# Patient Record
Sex: Female | Born: 1990 | Race: White | Hispanic: No | Marital: Married | Smoking: Never smoker
Health system: Southern US, Community
[De-identification: ages and names within clinical notes are randomized; demographics above are authoritative.]

## PROBLEM LIST (undated history)

## (undated) DIAGNOSIS — R519 Headache, unspecified: Secondary | ICD-10-CM

## (undated) DIAGNOSIS — F329 Major depressive disorder, single episode, unspecified: Secondary | ICD-10-CM

## (undated) DIAGNOSIS — F419 Anxiety disorder, unspecified: Secondary | ICD-10-CM

## (undated) DIAGNOSIS — N800 Endometriosis of uterus: Secondary | ICD-10-CM

## (undated) DIAGNOSIS — Z8614 Personal history of Methicillin resistant Staphylococcus aureus infection: Secondary | ICD-10-CM

## (undated) DIAGNOSIS — B9689 Other specified bacterial agents as the cause of diseases classified elsewhere: Secondary | ICD-10-CM

## (undated) DIAGNOSIS — N760 Acute vaginitis: Secondary | ICD-10-CM

## (undated) DIAGNOSIS — N8003 Adenomyosis of the uterus: Secondary | ICD-10-CM

## (undated) DIAGNOSIS — J45909 Unspecified asthma, uncomplicated: Secondary | ICD-10-CM

## (undated) DIAGNOSIS — B977 Papillomavirus as the cause of diseases classified elsewhere: Secondary | ICD-10-CM

## (undated) DIAGNOSIS — F32A Depression, unspecified: Secondary | ICD-10-CM

## (undated) DIAGNOSIS — R51 Headache: Secondary | ICD-10-CM

## (undated) HISTORY — DX: Acute vaginitis: N76.0

## (undated) HISTORY — DX: Major depressive disorder, single episode, unspecified: F32.9

## (undated) HISTORY — DX: Papillomavirus as the cause of diseases classified elsewhere: B97.7

## (undated) HISTORY — DX: Depression, unspecified: F32.A

## (undated) HISTORY — DX: Acute vaginitis: B96.89

## (undated) HISTORY — DX: Anxiety disorder, unspecified: F41.9

## (undated) HISTORY — DX: Other specified bacterial agents as the cause of diseases classified elsewhere: B96.89

---
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
## 2008-05-09 ENCOUNTER — Observation Stay: Payer: Self-pay

## 2008-06-23 ENCOUNTER — Observation Stay: Payer: Self-pay | Admitting: Unknown Physician Specialty

## 2008-06-25 ENCOUNTER — Observation Stay: Payer: Self-pay

## 2008-06-27 ENCOUNTER — Observation Stay: Payer: Self-pay

## 2008-07-05 ENCOUNTER — Inpatient Hospital Stay: Payer: Self-pay

## 2008-07-11 ENCOUNTER — Ambulatory Visit: Payer: Self-pay | Admitting: Pediatrics

## 2011-06-16 HISTORY — PX: TUBAL LIGATION: SHX77

## 2011-07-03 ENCOUNTER — Emergency Department: Payer: Self-pay | Admitting: Emergency Medicine

## 2011-07-03 LAB — URINALYSIS, COMPLETE
Blood: NEGATIVE
Nitrite: NEGATIVE
Ph: 5 (ref 4.5–8.0)
Protein: NEGATIVE
RBC,UR: 1 /HPF (ref 0–5)
WBC UR: 5 /HPF (ref 0–5)

## 2011-07-03 LAB — COMPREHENSIVE METABOLIC PANEL
Albumin: 4.4 g/dL (ref 3.4–5.0)
Anion Gap: 12 (ref 7–16)
Calcium, Total: 8.9 mg/dL (ref 8.5–10.1)
Chloride: 104 mmol/L (ref 98–107)
Co2: 22 mmol/L (ref 21–32)
EGFR (African American): >60
Glucose: 72 mg/dL (ref 65–99)
Osmolality: 273 (ref 275–301)
Potassium: 3.6 mmol/L (ref 3.5–5.1)
SGOT(AST): 21 U/L (ref 15–37)
Sodium: 138 mmol/L (ref 136–145)

## 2011-07-03 LAB — CBC WITH DIFFERENTIAL/PLATELET
Eosinophil #: 0.1 10*3/uL (ref 0.0–0.7)
Eosinophil %: 0.6 %
HCT: 41 % (ref 35.0–47.0)
Lymphocyte #: 1.8 10*3/uL (ref 1.0–3.6)
MCH: 31.7 pg (ref 26.0–34.0)
MCHC: 34.7 g/dL (ref 32.0–36.0)
MCV: 92 fL (ref 80–100)
Monocyte #: 0.5 10*3/uL (ref 0.0–0.7)
Monocyte %: 5.2 %
Neutrophil #: 7.7 10*3/uL — ABNORMAL HIGH (ref 1.4–6.5)
Platelet: 160 10*3/uL (ref 150–440)
RBC: 4.48 10*6/uL (ref 3.80–5.20)
RDW: 12.9 % (ref 11.5–14.5)
WBC: 10.1 10*3/uL (ref 3.6–11.0)

## 2011-07-03 LAB — LIPASE, BLOOD: Lipase: 211 U/L (ref 73–393)

## 2011-07-03 LAB — HCG, QUANTITATIVE, PREGNANCY: Beta Hcg, Quant.: 195466 m[IU]/mL — ABNORMAL HIGH

## 2014-03-28 DIAGNOSIS — N390 Urinary tract infection, site not specified: Secondary | ICD-10-CM | POA: Insufficient documentation

## 2014-07-19 ENCOUNTER — Ambulatory Visit: Payer: Self-pay | Admitting: Family Medicine

## 2015-02-07 ENCOUNTER — Encounter: Payer: Self-pay | Admitting: *Deleted

## 2015-02-07 ENCOUNTER — Ambulatory Visit (INDEPENDENT_AMBULATORY_CARE_PROVIDER_SITE_OTHER): Payer: BC Managed Care – PPO | Admitting: Obstetrics and Gynecology

## 2015-02-07 ENCOUNTER — Encounter: Payer: Self-pay | Admitting: Obstetrics and Gynecology

## 2015-02-07 VITALS — BP 108/67 | HR 76 | Wt 119.4 lb

## 2015-02-07 DIAGNOSIS — N938 Other specified abnormal uterine and vaginal bleeding: Secondary | ICD-10-CM | POA: Diagnosis not present

## 2015-02-07 DIAGNOSIS — N920 Excessive and frequent menstruation with regular cycle: Secondary | ICD-10-CM

## 2015-02-07 NOTE — Patient Instructions (Signed)
Thank you for enrolling in MyChart. Please follow the instructions below to securely access your online medical record. MyChart allows you to send messages to your doctor, view your test results, renew your prescriptions, schedule appointments, and more.  How Do I Sign Up? 1. In your Internet browser, go to [email or web address] 2. Click on the New  User? link in the Sign In box.  3. Enter your MyChart Access Code exactly as it appears below. You will not need to use this code after you have completed the sign-up process. If you do not sign up before the expiration date, you must request a new code. MyChart Access Code: [email or web address] Expires: 04/08/2015 12:07 PM  4. Enter the last four digits of your Social Security Number (xxxx) and Date of Birth (mm/dd/yyyy) as indicated and click Next. You will be taken to the next sign-up page. 5. Create a MyChart ID. This will be your MyChart login ID and cannot be changed, so think of one that is secure and easy to remember. 6. Create a MyChart password. You can change your password at any time. 7. Enter your Password Reset Question and Answer and click Next. This can be used at a later time if you forget your password.  8. Select your communication preference, and if applicable enter your e-mail address. You will receive e-mail notification when new information is available in MyChart by choosing to receive e-mail notifications and filling in your e-mail. 9. Click Sign In. You can now view your medical record.   Additional Information If you have questions, you can email [email or web address] or call [phone] to talk to our MyChart staff. Remember, MyChart is NOT to be used for urgent needs. For medical emergencies, dial 911. Dysfunctional Uterine Bleeding Normally, menstrual periods begin between ages 33 to 79 in young women. A normal menstrual cycle/period may begin every 23 days up to 35 days and lasts from 1 to 7  days. Around 12 to 14 days before your menstrual period starts, ovulation (ovary produces an egg) occurs. When counting the time between menstrual periods, count from the first day of bleeding of the previous period to the first day of bleeding of the next period. Dysfunctional (abnormal) uterine bleeding is bleeding that is different from a normal menstrual period. Your periods may come earlier or later than usual. They may be lighter, have blood clots or be heavier. You may have bleeding between periods, or you may skip one period or more. You may have bleeding after sexual intercourse, bleeding after menopause, or no menstrual period. CAUSES   Pregnancy (normal, miscarriage, tubal).  IUDs (intrauterine device, birth control).  Birth control pills.  Hormone treatment.  Menopause.  Infection of the cervix.  Blood clotting problems.  Infection of the inside lining of the uterus.  Endometriosis, inside lining of the uterus growing in the pelvis and other female organs.  Adhesions (scar tissue) inside the uterus.  Obesity or severe weight loss.  Uterine polyps inside the uterus.  Cancer of the vagina, cervix, or uterus.  Ovarian cysts or polycystic ovary syndrome.  Medical problems (diabetes, thyroid disease).  Uterine fibroids (noncancerous tumor).  Problems with your female hormones.  Endometrial hyperplasia, very thick lining and enlarged cells inside of the uterus.  Medicines that interfere with ovulation.  Radiation to the pelvis or abdomen.  Chemotherapy. DIAGNOSIS   Your doctor will discuss the history of your menstrual periods, medicines you are taking, changes in your weight, stress in your  life, and any medical problems you may have.  Your doctor will do a physical and pelvic examination.  Your doctor may want to perform certain tests to make a diagnosis, such as:  Pap test.  Blood tests.  Cultures for infection.  CT  scan.  Ultrasound.  Hysteroscopy.  Laparoscopy.  MRI.  Hysterosalpingography.  D and C.  Endometrial biopsy. TREATMENT  Treatment will depend on the cause of the dysfunctional uterine bleeding (DUB). Treatment may include:  Observing your menstrual periods for a couple of months.  Prescribing medicines for medical problems, including:  Antibiotics.  Hormones.  Birth control pills.  Removing an IUD (intrauterine device, birth control).  Surgery:  D and C (scrape and remove tissue from inside the uterus).  Laparoscopy (examine inside the abdomen with a lighted tube).  Uterine ablation (destroy lining of the uterus with electrical current, laser, heat, or freezing).  Hysteroscopy (examine cervix and uterus with a lighted tube).  Hysterectomy (remove the uterus). HOME CARE INSTRUCTIONS   If medicines were prescribed, take exactly as directed. Do not change or switch medicines without consulting your caregiver.  Long term heavy bleeding may result in iron deficiency. Your caregiver may have prescribed iron pills. They help replace the iron that your body lost from heavy bleeding. Take exactly as directed.  Do not take aspirin or medicines that contain aspirin one week before or during your menstrual period. Aspirin may make the bleeding worse.  If you need to change your sanitary pad or tampon more than once every 2 hours, stay in bed with your feet elevated and a cold pack on your lower abdomen. Rest as much as possible, until the bleeding stops or slows down.  Eat well-balanced meals. Eat foods high in iron. Examples are:  Leafy green vegetables.  Whole-grain breads and cereals.  Eggs.  Meat.  Liver.  Do not try to lose weight until the abnormal bleeding has stopped and your blood iron level is back to normal. Do not lift more than ten pounds or do strenuous activities when you are bleeding.  For a couple of months, make note on your calendar, marking the  start and ending of your period, and the type of bleeding (light, medium, heavy, spotting, clots or missed periods). This is for your caregiver to better evaluate your problem. SEEK MEDICAL CARE IF:   You develop nausea (feeling sick to your stomach) and vomiting, dizziness, or diarrhea while you are taking your medicine.  You are getting lightheaded or weak.  You have any problems that may be related to the medicine you are taking.  You develop pain with your DUB.  You want to remove your IUD.  You want to stop or change your birth control pills or hormones.  You have any type of abnormal bleeding mentioned above.  You are over 90 years old and have not had a menstrual period yet.  You are 24 years old and you are still having menstrual periods.  You have any of the symptoms mentioned above.  You develop a rash. SEEK IMMEDIATE MEDICAL CARE IF:   An oral temperature above 102 F (38.9 C) develops.  You develop chills.  You are changing your sanitary pad or tampon more than once an hour.  You develop abdominal pain.  You pass out or faint. Document Released: 05/29/2000 Document Revised: 08/24/2011 Document Reviewed: 04/30/2009 [employer] Patient Information 2015 [place], [place]. This information is not intended to replace advice given to you by your health care provider.  Make sure you discuss any questions you have with your health care provider.  

## 2015-02-07 NOTE — Progress Notes (Signed)
Subjective:     Patient ID: Carrie Rios, female   DOB: February 03, 1991, 24 y.o.   MRN: [id number]  HPI Reports heavy menses since last delivery of last child and BTL in Aug 2013, with 3 menses occuring in the last 6 weeks.   Review of Systems Menses last 5-7 days with heavy bleeding 1-3 days, and mild cramps normally, the bleeding over the last 6 weeks has been heavy with moderate cramps, changing tampons q2h,     Objective:   Physical Exam A&O x4 Well groomed female in no distress Thyroid  Pelvic exam: normal external genitalia, vulva, vagina, cervix, uterus and adnexa.     Assessment:     Irregular menses with menorrgahia     Plan:     Labs obtained, pelvic u/s scheduled.  Also past due for AE will schedule

## 2015-02-08 LAB — THYROID PANEL WITH TSH
Free Thyroxine Index: 2.6 (ref 1.2–4.9)
T3 Uptake Ratio: 29 % (ref 24–39)
T4, Total: 9.1 ug/dL (ref 4.5–12.0)
TSH: 1.93 u[IU]/mL (ref 0.450–4.500)

## 2015-02-08 LAB — CBC WITH DIFFERENTIAL/PLATELET
BASOS ABS: 0 10*3/uL (ref 0.0–0.2)
Basos: 0 %
EOS (ABSOLUTE): 0.1 10*3/uL (ref 0.0–0.4)
Eos: 2 %
HEMOGLOBIN: 13.2 g/dL (ref 11.1–15.9)
Hematocrit: 38.6 % (ref 34.0–46.6)
IMMATURE GRANS (ABS): 0 10*3/uL (ref 0.0–0.1)
Immature Granulocytes: 0 %
LYMPHS ABS: 1 10*3/uL (ref 0.7–3.1)
LYMPHS: 22 %
MCH: 30.4 pg (ref 26.6–33.0)
MCHC: 34.2 g/dL (ref 31.5–35.7)
MCV: 89 fL (ref 79–97)
MONOCYTES: 9 %
Monocytes Absolute: 0.4 10*3/uL (ref 0.1–0.9)
NEUTROS ABS: 3.3 10*3/uL (ref 1.4–7.0)
Neutrophils: 67 %
PLATELETS: 190 10*3/uL (ref 150–379)
RBC: 4.34 x10E6/uL (ref 3.77–5.28)
RDW: 13.2 % (ref 12.3–15.4)
WBC: 4.8 10*3/uL (ref 3.4–10.8)

## 2015-02-08 LAB — FSH/LH
FSH: 7.1 m[IU]/mL
LH: 5.5 m[IU]/mL

## 2015-02-08 LAB — FERRITIN: Ferritin: 32 ng/mL (ref 15–150)

## 2015-02-22 ENCOUNTER — Ambulatory Visit (INDEPENDENT_AMBULATORY_CARE_PROVIDER_SITE_OTHER): Payer: BC Managed Care – PPO

## 2015-02-22 DIAGNOSIS — N938 Other specified abnormal uterine and vaginal bleeding: Secondary | ICD-10-CM

## 2015-02-22 DIAGNOSIS — N8320 Unspecified ovarian cysts: Secondary | ICD-10-CM

## 2015-02-22 DIAGNOSIS — N83201 Unspecified ovarian cyst, right side: Secondary | ICD-10-CM

## 2015-03-07 ENCOUNTER — Telehealth: Payer: Self-pay | Admitting: *Deleted

## 2015-03-07 NOTE — Telephone Encounter (Signed)
Pt called is having R pelvic pain, states the pain is constant x 1 day, per pt she is still taking her bc pills in her 3rd week She started bleeding again 03/05/15- states the bleeding isnt really heavy- she just didn't know what to do

## 2015-03-08 ENCOUNTER — Other Ambulatory Visit: Payer: Self-pay | Admitting: Obstetrics and Gynecology

## 2015-03-08 NOTE — Telephone Encounter (Signed)
Reassure her that this is all normal when first starting a pill, especially in leu of previous menstrual cycles, to keep taking the pill and spotting/bleeding should get better controlled by the time she comes in for next visit in Oct.  But to let us know if pain becomes severe or bleeding is heavy enough to soak a pad in an hour.

## 2015-03-08 NOTE — Telephone Encounter (Signed)
Called pt she voiced understanding 

## 2015-03-28 ENCOUNTER — Encounter: Payer: Self-pay | Admitting: Obstetrics and Gynecology

## 2015-03-28 ENCOUNTER — Other Ambulatory Visit: Payer: Self-pay | Admitting: Obstetrics and Gynecology

## 2015-03-28 ENCOUNTER — Ambulatory Visit (INDEPENDENT_AMBULATORY_CARE_PROVIDER_SITE_OTHER): Payer: BC Managed Care – PPO | Admitting: Obstetrics and Gynecology

## 2015-03-28 VITALS — BP 99/62 | HR 67 | Ht 61.0 in | Wt 120.8 lb

## 2015-03-28 DIAGNOSIS — Z01419 Encounter for gynecological examination (general) (routine) without abnormal findings: Secondary | ICD-10-CM | POA: Diagnosis not present

## 2015-03-28 MED ORDER — NORETHIN-ETH ESTRAD-FE BIPHAS 1 MG-10 MCG / 10 MCG PO TABS: 1.0000 | ORAL_TABLET | Freq: Every day | ORAL | Status: DC

## 2015-03-28 NOTE — Progress Notes (Signed)
  Subjective:     Carrie Rios is a 24 y.o. female and is here for a comprehensive physical exam. The patient reports improvement in bleeding since starting LoLoestrin and desires to continue at this time.  Social History   Social History  . Marital Status: Married    Spouse Name: N/A  . Number of Children: N/A  . Years of Education: N/A   Occupational History  . Not on file.   Social History Main Topics  . Smoking status: Never Smoker   . Smokeless tobacco: Never Used  . Alcohol Use: No  . Drug Use: No  . Sexual Activity: Yes    Birth Control/ Protection: Surgical   Other Topics Concern  . Not on file   Social History Narrative   Health Maintenance  Topic Date Due  . HIV Screening  02/07/2004  . TETANUS/TDAP  02/07/2008  . PAP SMEAR  02/06/2010  . INFLUENZA VACCINE  01/14/2015    The following portions of the patient's history were reviewed and updated as appropriate: allergies, current medications, past family history, past medical history, past social history, past surgical history and problem list.  Review of Systems Pertinent items noted in HPI and remainder of comprehensive ROS otherwise negative.   Objective:    General appearance: alert, cooperative and appears stated age Neck: no adenopathy, no carotid bruit, no JVD, supple, symmetrical, trachea midline and thyroid not enlarged, symmetric, no tenderness/mass/nodules Lungs: clear to auscultation bilaterally Breasts: normal appearance, no masses or tenderness Heart: regular rate and rhythm, S1, S2 normal, no murmur, click, rub or gallop Abdomen: soft, non-tender; bowel sounds normal; no masses,  no organomegaly Pelvic: cervix normal in appearance, external genitalia normal, no adnexal masses or tenderness, no cervical motion tenderness, rectovaginal septum normal, uterus normal size, shape, and consistency and vagina normal without discharge Extremities: extremities normal, atraumatic, no cyanosis or edema     Assessment:    Healthy female exam. DUB improved with OCP; s/p tubal ligation, h/o abnormal pap      Plan:  Pap obtained Sample of LoLoEstrin and rx sent in, will keep appt for f/u u/s.  Otherwise RTC 1 year or prn.   See After Visit Summary for Counseling Recommendations

## 2015-03-28 NOTE — Patient Instructions (Signed)
Place annual gynecologic exam patient instructions here.

## 2015-03-29 LAB — CYTOLOGY - PAP

## 2015-04-03 ENCOUNTER — Encounter: Payer: Self-pay | Admitting: Obstetrics and Gynecology

## 2015-04-09 ENCOUNTER — Other Ambulatory Visit: Payer: Self-pay | Admitting: Obstetrics and Gynecology

## 2015-04-09 DIAGNOSIS — R87613 High grade squamous intraepithelial lesion on cytologic smear of cervix (HGSIL): Secondary | ICD-10-CM

## 2015-04-10 ENCOUNTER — Telehealth: Payer: Self-pay | Admitting: Obstetrics and Gynecology

## 2015-04-10 NOTE — Telephone Encounter (Signed)
PT CALLED AND STATED SHE WOULD LIKE A CALL BACK.

## 2015-04-10 NOTE — Telephone Encounter (Signed)
Pt states [facility] called her about pap, she was given 05/17/15 for colpo, pt states she felt like [facility] wanted this before, she is coming in 04/12/15 wants to know can we just "squeeze" her in then @ 3:45???

## 2015-04-12 ENCOUNTER — Ambulatory Visit: Payer: BC Managed Care – PPO

## 2015-04-12 ENCOUNTER — Encounter: Payer: Self-pay | Admitting: Obstetrics and Gynecology

## 2015-04-12 ENCOUNTER — Ambulatory Visit (INDEPENDENT_AMBULATORY_CARE_PROVIDER_SITE_OTHER): Payer: BC Managed Care – PPO | Admitting: Obstetrics and Gynecology

## 2015-04-12 VITALS — BP 115/46 | HR 92 | Ht 61.0 in | Wt 120.9 lb

## 2015-04-12 DIAGNOSIS — R102 Pelvic and perineal pain: Secondary | ICD-10-CM | POA: Diagnosis not present

## 2015-04-12 DIAGNOSIS — N938 Other specified abnormal uterine and vaginal bleeding: Secondary | ICD-10-CM | POA: Diagnosis not present

## 2015-04-12 NOTE — Telephone Encounter (Signed)
Discussed with pt she voiced understanding

## 2015-04-12 NOTE — Progress Notes (Signed)
Patient ID: Carrie LoserSarah M Leighton, female   DOB: 27-Dec-1990, 24 y.o.   MRN: [id number]  Here to discuss f/u u/s:  Indications:  Follow up Right ovarian cyst Findings:  The uterus measures 6.7 x 3.8 x 4.4 cm and is retroverted. Echo texture is homogenous without evidence of focal masses.  The Endometrium measures 2.8 mm with a trace amount of free fluid seen in the endometrial canal.  Right Ovary measures 2.1 x 1.1 x 1.7 cm. It is normal in appearance. Left Ovary measures 2.2 x 1.3 x 1.9 cm. It is normal appearance. Survey of the adnexa demonstrates no adnexal masses. There is a small amount of free fluid in the cul de sac.  Impression: 1. Right ovarian cyst is no longer seen.  2. Retroverted uterus appears wnl. 3. Small amount of free fluid seen in cul-de-sac.  Reviewed scan with patient, denies pain.  RTC as needed.  Nekeisha Aure Ines BloomerBurr, CNM

## 2015-04-12 NOTE — Telephone Encounter (Signed)
Only thing I know to do is put her on cancelation call list, or see if one of Drs can do it sooner, can't do at todays visit.

## 2015-04-12 NOTE — Patient Instructions (Signed)
Folic Acid, Vitamin B9 tablets What is this medicine? FOLIC ACID (FOE lik AS id) is a water-soluble, B complex vitamin. It is in many foods like liver, kidneys, yeast, and leafy, green vegetables. It is used to treat megaloblastic anemia and anemia from poor diet in pregnant women, babies, and children. This medicine may be used for other purposes; ask your health care provider or pharmacist if you have questions. What should I tell my health care provider before I take this medicine? They need to know if you have any of these conditions: -alcoholism or alcohol cirrhosis -pernicious anemia -vitamin B12 deficient anemia -an unusual or allergic reaction to folic acid, other B vitamins, other medicines, foods, dyes, or preservatives -pregnant or trying to get pregnant -breast-feeding How should I use this medicine? Take this medicine by mouth with a glass of water. Follow the directions on your prescription label. Take your doses at regular intervals. Do not stop taking your medicine unless your doctor tells you to. Talk to your pediatrician regarding the use of this medicine in children. While this drug may be prescribed for selected conditions, precautions do apply. Overdosage: If you think you have taken too much of this medicine contact a poison control center or emergency room at once. NOTE: This medicine is only for you. Do not share this medicine with others. What if I miss a dose? If you miss a dose, take it as soon as you can. If it is almost time for your next dose, take only that dose. Do not take double or extra doses. What may interact with this medicine? -chloramphenicol -cholestyramine -medicines for seizures -methotrexate -nitrofurantoin -pyrimethamine This list may not describe all possible interactions. Give your health care provider a list of all the medicines, herbs, non-prescription drugs, or dietary supplements you use. Also tell them if you smoke, drink alcohol, or use  illegal drugs. Some items may interact with your medicine. What should I watch for while using this medicine? Visit your doctor or health care professional for regular check ups. Your doctor may order blood tests. You need to eat a proper diet even while you are taking this vitamin. Taking vitamin supplements is not a substitute for a healthy diet. Ask your doctor or health care provider for good nutrition advice. What side effects may I notice from receiving this medicine? Side effects that you should report to your doctor or health care professional as soon as possible: -allergic reactions such as skin rash or itching, hives, swelling of the lips, mouth, tongue, or throat -chest tightness or pain -wheezing or shortness of breath Side effects that usually do not require medical attention (report to your doctor or health care professional if they continue or are bothersome): -bitter or bad taste -confusion -irritable -loss of appetite -nausea -stomach gas This list may not describe all possible side effects. Call your doctor for medical advice about side effects. You may report side effects to FDA at 1-800-FDA-1088. Where should I keep my medicine? Keep out of the reach of children. Store at room temperature between 15 and 30 degrees C (59 and 86 degrees F). Protect from light. This medicine is quickly broken down and made inactive when exposed to heat or light. Throw away any unused medicine after the expiration date. NOTE: This sheet is a summary. It may not cover all possible information. If you have questions about this medicine, talk to your doctor, pharmacist, or health care provider.    2016, Elsevier/Gold Standard. (2007-10-05 17:03:56)

## 2015-04-25 ENCOUNTER — Ambulatory Visit (INDEPENDENT_AMBULATORY_CARE_PROVIDER_SITE_OTHER): Payer: BC Managed Care – PPO | Admitting: Obstetrics and Gynecology

## 2015-04-25 ENCOUNTER — Encounter: Payer: Self-pay | Admitting: Obstetrics and Gynecology

## 2015-04-25 VITALS — BP 111/68 | HR 76 | Ht 61.0 in | Wt 120.6 lb

## 2015-04-25 DIAGNOSIS — G8929 Other chronic pain: Secondary | ICD-10-CM

## 2015-04-25 DIAGNOSIS — N871 Moderate cervical dysplasia: Secondary | ICD-10-CM | POA: Diagnosis not present

## 2015-04-25 DIAGNOSIS — R102 Pelvic and perineal pain: Secondary | ICD-10-CM | POA: Diagnosis not present

## 2015-04-25 DIAGNOSIS — N938 Other specified abnormal uterine and vaginal bleeding: Secondary | ICD-10-CM | POA: Insufficient documentation

## 2015-04-25 DIAGNOSIS — N949 Unspecified condition associated with female genital organs and menstrual cycle: Secondary | ICD-10-CM

## 2015-04-25 DIAGNOSIS — N946 Dysmenorrhea, unspecified: Secondary | ICD-10-CM | POA: Diagnosis not present

## 2015-04-25 DIAGNOSIS — R87613 High grade squamous intraepithelial lesion on cytologic smear of cervix (HGSIL): Secondary | ICD-10-CM | POA: Diagnosis not present

## 2015-04-25 NOTE — Patient Instructions (Addendum)
1.  Colposcopy with biopsies are done today for evaluation of abnormal Pap smear. 2.  Return in 10 days for follow-up on biopsies and further management planning regarding pelvic pain and abnormal uterine bleeding, (As well as abnormal Pap smear). 3.  Literature given on endometriosis and laparoscopy

## 2015-04-25 NOTE — Addendum Note (Signed)
Addended by: Marchelle FolksMILLER, Christinna Sprung G on: 04/25/2015 02:54 PM   Modules accepted: Orders

## 2015-04-25 NOTE — Progress Notes (Signed)
Patient ID: Jasper LoserSarah M Saathoff, female   DOB: December 17, 1990, 24 y.o.   MRN: [id number] 10/13 hgsil cin 2-3 H/o of abn paps and repeat colpos- no leep or crytotherapy colpo- today  Chief complaint: 1.  HGSIL Pap smear. 2.  Abnormal uterine bleeding. 3.  Pelvic pain.  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 She has long history of abnormal Pap smears without treatment. In addition, the patient has been having chronic abnormal uterine bleeding and chronic pelvic pain in the form of severe dysmenorrhea and perimenstrual low backache which has been refractory to medical therapy.  At this point ,currently she is taking continuous oral contraceptives with persistent dysfunctional uterine bleeding.  Past Medical History  Diagnosis Date  . Anxiety   . Depression   . HPV in female    Past Surgical History  Procedure Laterality Date  . Tubal ligation  2013   Review of systems: Per HPI.  OBJECTIVE: BP 111/68 mmHg  Pulse 76  Ht 5\' 1"  (1.549 m)  Wt 120 lb 9.6 oz (54.704 kg)  BMI 22.80 kg/m2  LMP 04/17/2015 Pleasant white female in no acute distress.  Abdomen: Soft, nontender. Pelvic exam: External genitalia-normal BUS-normal Vagina-normal. Cervix-parous; no ectocervical lesions Uterus-not examined. Adnexa-not examined.  PROCEDURE:  Colposcopy Upper vagina normal. Cervix: No ectocervical lesions; squamocolumnar junction not fully seen Despite using endocervical speculum; white epithelium at 6:00 extending into the canal where full extent of the lesion cannot be seen  Assessment: 1.  HGSIL Pap. 2.  Inadequate colposcopy. 3.  White epithelium 6:00; full lesion not visualized. 4.  Dysmenorrhea and chronic pelvic pain. 5.  Dysfunctional uterine bleeding refractory to hormonal therapy. 6.  Possible endometriosis  Plan: 1.  ECC. 2.   Cervical biopsy 6:00. 3.  Return in 10 days for follow-up on biopsies and further management planning. 4.  Patient understands she will likely need LEEP cone biopsy for further assessment and management of this condition. 5.  Literature on endometriosis and laparoscopy given. 6.  We'll address possible laparoscopy at next visit to assess her chronic pelvic pain and dysfunctional uterine bleeding.  Herold HarmsMartin A Gareth Fitzner, MD   Note: This dictation was prepared with Dragon dictation along with smaller phrase technology. Any transcriptional errors that result from this process are unintentional.

## 2015-04-29 ENCOUNTER — Encounter: Payer: Self-pay | Admitting: Obstetrics and Gynecology

## 2015-04-29 LAB — PATHOLOGY

## 2015-05-08 ENCOUNTER — Ambulatory Visit (INDEPENDENT_AMBULATORY_CARE_PROVIDER_SITE_OTHER): Payer: BC Managed Care – PPO | Admitting: Obstetrics and Gynecology

## 2015-05-08 ENCOUNTER — Encounter: Payer: Self-pay | Admitting: Obstetrics and Gynecology

## 2015-05-08 VITALS — BP 110/70 | HR 85 | Ht 61.0 in | Wt 122.0 lb

## 2015-05-08 DIAGNOSIS — N871 Moderate cervical dysplasia: Secondary | ICD-10-CM

## 2015-05-08 DIAGNOSIS — N946 Dysmenorrhea, unspecified: Secondary | ICD-10-CM | POA: Diagnosis not present

## 2015-05-08 DIAGNOSIS — R87613 High grade squamous intraepithelial lesion on cytologic smear of cervix (HGSIL): Secondary | ICD-10-CM | POA: Diagnosis not present

## 2015-05-08 DIAGNOSIS — N949 Unspecified condition associated with female genital organs and menstrual cycle: Secondary | ICD-10-CM | POA: Diagnosis not present

## 2015-05-08 DIAGNOSIS — N938 Other specified abnormal uterine and vaginal bleeding: Secondary | ICD-10-CM | POA: Diagnosis not present

## 2015-05-08 DIAGNOSIS — G8929 Other chronic pain: Secondary | ICD-10-CM | POA: Diagnosis not present

## 2015-05-08 DIAGNOSIS — N920 Excessive and frequent menstruation with regular cycle: Secondary | ICD-10-CM | POA: Diagnosis not present

## 2015-05-08 DIAGNOSIS — R102 Pelvic and perineal pain: Secondary | ICD-10-CM

## 2015-05-08 NOTE — Progress Notes (Signed)
Patient ID: Carrie LoserSarah M Rios, female   DOB: Jun 27, 1990, [age] y.o.   MRN: [id number]  colpo results- hgsil pap ecc- wnl cx bx- cin3 Discuss lap- cpp and dub  Chief complaint: 1.  High-grade dysplasia. 2.  Dysfunctional uterine bleeding. 3.  Dysmenorrhea.  Patient presents for us to discuss the above issues.  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   Patient does need to have LEEP cone biopsy for further assessment of this condition.  Patient has had dysfunctional uterine bleeding and has ultrasound demonstrating 10 mm endometrial stripe without pathologic abnormalities within the uterus.  Expectant management versus dilation and curettage of the endometrium were discussed to help further assess and regulate her bleeding.  She does desire to have the D&C at the time of her LEEP cone biopsy.  Because of the patient's significant dysmenorrhea, she is wishing to proceed with laparoscopy to rule out endometriosis as a possible etiology to this condition.  She does understand that if endometriosis is identified, then ultimately hysterectomy may b an official in resolving pelvic pain, abnormal uterine bleeding, and cervical dysplasia.  ASSESSMENT: 1.  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 2.  Dysfunctional uterine bleeding refractory to medical therapy. 3.  Worsening dysmenorrhea, possibly due to endometriosis.  PLAN: 1.  LEEP cone biopsy of the cervix.. 2.  Hysteroscopy/D&C at the time of the LEEP cone biopsy. 3.  Laparoscopy with biopsies to assess etiology of chronic pelvic pain. All surgeries will be performed the same date.  A total of 15 minutes were spent face-to-face with the patient during this encounter and over half of that time dealt with counseling and coordination of care.  Herold HarmsMartin A Kaoir Loree, MD  Note: This dictation was prepared with Dragon dictation along with smaller phrase  technology. Any transcriptional errors that result from this process are unintentional.

## 2015-05-08 NOTE — Patient Instructions (Signed)
1.  Return for preoperative appointment prior to surgery. 2.  LEEP cone biopsy, hysteroscopy/D&C, and laparoscopy with biopsies, all scheduled for the second week in December 2016

## 2015-05-17 ENCOUNTER — Encounter: Payer: BC Managed Care – PPO | Admitting: Obstetrics and Gynecology

## 2015-05-23 ENCOUNTER — Ambulatory Visit (INDEPENDENT_AMBULATORY_CARE_PROVIDER_SITE_OTHER): Payer: BC Managed Care – PPO | Admitting: Obstetrics and Gynecology

## 2015-05-23 ENCOUNTER — Other Ambulatory Visit: Payer: Self-pay

## 2015-05-23 ENCOUNTER — Encounter: Payer: Self-pay | Admitting: Obstetrics and Gynecology

## 2015-05-23 ENCOUNTER — Encounter: Payer: Self-pay | Admitting: *Deleted

## 2015-05-23 VITALS — BP 96/63 | HR 94 | Ht 61.0 in | Wt 119.4 lb

## 2015-05-23 DIAGNOSIS — N938 Other specified abnormal uterine and vaginal bleeding: Secondary | ICD-10-CM

## 2015-05-23 DIAGNOSIS — N871 Moderate cervical dysplasia: Secondary | ICD-10-CM

## 2015-05-23 DIAGNOSIS — Z01818 Encounter for other preprocedural examination: Secondary | ICD-10-CM

## 2015-05-23 DIAGNOSIS — R102 Pelvic and perineal pain: Secondary | ICD-10-CM

## 2015-05-23 DIAGNOSIS — N946 Dysmenorrhea, unspecified: Secondary | ICD-10-CM

## 2015-05-23 NOTE — H&P (Signed)
Subjective: PREOPERATIVE HISTORY AND PHYSICAL  Date of surgery: 05/27/2015 Chief complaint: 1. High-grade dysplasia. 2. Dysfunctional uterine bleeding. 3. Dysmenorrhea.  24 year old white female, para 2002, status post BTL for contraception, presents for surgical management.  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 Patient does need to have LEEP cone biopsy for further assessment of this condition.  Patient has had dysfunctional uterine bleeding and has ultrasound demonstrating 10 mm endometrial stripe without pathologic abnormalities within the uterus. Expectant management versus dilation and curettage of the endometrium were discussed to help further assess and regulate her bleeding. She does desire to have the D&C at the time of her LEEP cone biopsy.  Because of the patient's significant dysmenorrhea, she is wishing to proceed with laparoscopy to rule out endometriosis as a possible etiology to this condition. She does understand that if endometriosis is identified, then ultimately hysterectomy may be successful in resolving pelvic pain, abnormal uterine bleeding, and cervical dysplasia.  Pertinent Gynecological History: Menses: Irregular Contraception: BTL   Menstrual History: OB History    Gravida Para Term Preterm AB TAB SAB Ectopic Multiple Living   2         2      Menarche age: na Patient's last menstrual period was 05/06/2015.    Past Medical History  Diagnosis Date  . Anxiety   . Depression   . HPV in female     Past Surgical History  Procedure Laterality Date  . Tubal ligation  2013    OB History  Gravida Para Term Preterm AB SAB TAB Ectopic Multiple Living  2         2    # Outcome Date GA Lbr Len/2nd Weight Sex Delivery Anes PTL Lv  2 Gravida 2013    M Vag-Spont   Y  1 Gravida 2010    F Vag-Spont   Y      Social History   Social History   . Marital Status: Married    Spouse Name: N/A  . Number of Children: N/A  . Years of Education: N/A   Social History Main Topics  . Smoking status: Never Smoker   . Smokeless tobacco: Never Used  . Alcohol Use: No  . Drug Use: No  . Sexual Activity: Yes    Birth Control/ Protection: Surgical   Other Topics Concern  . None   Social History Narrative    Family History  Problem Relation Age of Onset  . Breast cancer Maternal Grandmother   . Cancer Maternal Grandmother      (Not in a hospital admission)  No Known Allergies  Review of Systems Constitutional: No recent fever/chills/sweats Respiratory: No recent cough/bronchitis Cardiovascular: No chest pain Gastrointestinal: No recent nausea/vomiting/diarrhea Genitourinary: No UTI symptoms Hematologic/lymphatic:No history of coagulopathy or recent blood thinner use    Objective:    BP 96/63 mmHg  Pulse 94  Ht 5\' 1"  (1.549 m)  Wt 119 lb 6.4 oz (54.159 kg)  BMI 22.57 kg/m2  LMP 05/06/2015  General:   Normal  Skin:   normal  HEENT:  Normal  Neck:  Supple without Adenopathy or Thyromegaly  Lungs:   Heart:              Breasts:   Abdomen:  Pelvis:  M/S   Extremeties:  Neuro:    clear to auscultation bilaterally   Normal without murmur   Not Examined   soft,  non-tender; bowel sounds normal; no masses,  no organomegaly   Exam deferred to OR  No CVAT  Warm/Dry   Normal          Assessment:  1.  High-grade dysplasia. 2.  Abnormal uterine bleeding refractory to medical therapy. 3.  Chronic pelvic pain/dysmenorrhea, consistent with endometriosis  Plan:   1.  LEEP cone biopsy. 2.  Hysteroscopy/D&C. 3.  Laparoscopy with peritoneal biopsies.  Preoperative counseling: Patient is to undergo surgery on 05/27/2015.  She is understanding of the planned procedures and is aware of and is accepting of all surgical risks which include but are not limited to bleeding, infection, pelvic organ injury, need for  repair, blood clots disorders, anesthesia risks, etc.  All questions are answered.  Informed consent is given.  Patient is ready and willing to proceed with surgery as scheduled.  Herold Harms, MD  Note: This dictation was prepared with Dragon dictation along with smaller phrase technology. Any transcriptional errors that result from this process are unintentional.

## 2015-05-23 NOTE — Patient Instructions (Signed)
1.  Return in 1 week for postop check 

## 2015-05-23 NOTE — Patient Instructions (Signed)
  Your procedure is scheduled on: 05-27-15 Report to [facility] 2ND FLOOR To find out your arrival time please call [phone] between 1PM - 3PM on 05-24-15  Remember: Instructions that are not followed completely may result in serious medical risk, up to and including death, or upon the discretion of your surgeon and anesthesiologist your surgery may need to be rescheduled.    _X___ 1. Do not eat food or drink liquids after midnight. No gum chewing or hard candies.     _X___ 2. No Alcohol for 24 hours before or after surgery.   ____ 3. Bring all medications with you on the day of surgery if instructed.    ____ 4. Notify your doctor if there is any change in your medical condition     (cold, fever, infections).     Do not wear jewelry, make-up, hairpins, clips or nail polish.  Do not wear lotions, powders, or perfumes. You may wear deodorant.  Do not shave 48 hours prior to surgery. Men may shave face and neck.  Do not bring valuables to the hospital.    [facility] is not responsible for any belongings or valuables.               Contacts, dentures or bridgework may not be worn into surgery.  Leave your suitcase in the car. After surgery it may be brought to your room.  For patients admitted to the hospital, discharge time is determined by your treatment team.   Patients discharged the day of surgery will not be allowed to drive home.   Please read over the following fact sheets that you were given:      ____ Take these medicines the morning of surgery with A SIP OF WATER:    1.NONE  2.   3.   4.  5.  6.  ____ Fleet Enema (as directed)   ____ Use CHG Soap as directed  ____ Use inhalers on the day of surgery  ____ Stop metformin 2 days prior to surgery    ____ Take 1/2 of usual insulin dose the night before surgery and none on the morning of surgery.   _X___ Stop Coumadin/Plavix/aspirin-STOP EXCEDRIN MIGRAINE NOW  ____ Stop  Anti-inflammatories-NO NSAIDS OR ASA PRODUCTS-TYLENOL OK   ____ Stop supplements until after surgery.    ____ Bring C-Pap to the hospital.

## 2015-05-23 NOTE — Progress Notes (Signed)
Subjective: PREOPERATIVE HISTORY AND PHYSICAL  Date of surgery: 05/27/2015 Chief complaint: 1. High-grade dysplasia. 2. Dysfunctional uterine bleeding. 3. Dysmenorrhea.  24-year-old white female, para 2002, status post BTL for contraception, presents for surgical management.  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 Patient does need to have LEEP cone biopsy for further assessment of this condition.  Patient has had dysfunctional uterine bleeding and has ultrasound demonstrating 10 mm endometrial stripe without pathologic abnormalities within the uterus. Expectant management versus dilation and curettage of the endometrium were discussed to help further assess and regulate her bleeding. She does desire to have the D&C at the time of her LEEP cone biopsy.  Because of the patient's significant dysmenorrhea, she is wishing to proceed with laparoscopy to rule out endometriosis as a possible etiology to this condition. She does understand that if endometriosis is identified, then ultimately hysterectomy may be successful in resolving pelvic pain, abnormal uterine bleeding, and cervical dysplasia.  Pertinent Gynecological History: Menses: Irregular Contraception: BTL   Menstrual History: OB History    Gravida Para Term Preterm AB TAB SAB Ectopic Multiple Living   2         2      Menarche age: na Patient's last menstrual period was 05/06/2015.    Past Medical History  Diagnosis Date  . Anxiety   . Depression   . HPV in female     Past Surgical History  Procedure Laterality Date  . Tubal ligation  2013    OB History  Gravida Para Term Preterm AB SAB TAB Ectopic Multiple Living  2         2    # Outcome Date GA Lbr Len/2nd Weight Sex Delivery Anes PTL Lv  2 Gravida 2013    M Vag-Spont   Y  1 Gravida 2010    F Vag-Spont   Y      Social History   Social History   . Marital Status: Married    Spouse Name: N/A  . Number of Children: N/A  . Years of Education: N/A   Social History Main Topics  . Smoking status: Never Smoker   . Smokeless tobacco: Never Used  . Alcohol Use: No  . Drug Use: No  . Sexual Activity: Yes    Birth Control/ Protection: Surgical   Other Topics Concern  . None   Social History Narrative    Family History  Problem Relation Age of Onset  . Breast cancer Maternal Grandmother   . Cancer Maternal Grandmother      (Not in a hospital admission)  No Known Allergies  Review of Systems Constitutional: No recent fever/chills/sweats Respiratory: No recent cough/bronchitis Cardiovascular: No chest pain Gastrointestinal: No recent nausea/vomiting/diarrhea Genitourinary: No UTI symptoms Hematologic/lymphatic:No history of coagulopathy or recent blood thinner use    Objective:    BP 96/63 mmHg  Pulse 94  Ht 5' 1" (1.549 m)  Wt 119 lb 6.4 oz (54.159 kg)  BMI 22.57 kg/m2  LMP 05/06/2015  General:   Normal  Skin:   normal  HEENT:  Normal  Neck:  Supple without Adenopathy or Thyromegaly  Lungs:   Heart:              Breasts:   Abdomen:  Pelvis:  M/S   Extremeties:  Neuro:    clear to auscultation bilaterally   Normal without murmur   Not Examined   soft,   non-tender; bowel sounds normal; no masses,  no organomegaly   Exam deferred to OR  No CVAT  Warm/Dry   Normal          Assessment:  1.  High-grade dysplasia. 2.  Abnormal uterine bleeding refractory to medical therapy. 3.  Chronic pelvic pain/dysmenorrhea, consistent with endometriosis  Plan:   1.  LEEP cone biopsy. 2.  Hysteroscopy/D&C. 3.  Laparoscopy with peritoneal biopsies.  Preoperative counseling: Patient is to undergo surgery on 05/27/2015.  She is understanding of the planned procedures and is aware of and is accepting of all surgical risks which include but are not limited to bleeding, infection, pelvic organ injury, need for  repair, blood clots disorders, anesthesia risks, etc.  All questions are answered.  Informed consent is given.  Patient is ready and willing to proceed with surgery as scheduled.  Carrie Catapano A Dalton Mille, MD  Note: This dictation was prepared with Dragon dictation along with smaller phrase technology. Any transcriptional errors that result from this process are unintentional.  

## 2015-05-27 ENCOUNTER — Ambulatory Visit: Payer: BC Managed Care – PPO | Admitting: Anesthesiology

## 2015-05-27 ENCOUNTER — Ambulatory Visit (HOSPITAL_BASED_OUTPATIENT_CLINIC_OR_DEPARTMENT_OTHER)
Admission: RE | Admit: 2015-05-27 | Discharge: 2015-05-27 | Disposition: A | Discharge status: Home / Self Care | Payer: BC Managed Care – PPO | Source: Ambulatory Visit | Attending: Obstetrics and Gynecology | Admitting: Obstetrics and Gynecology

## 2015-05-27 ENCOUNTER — Emergency Department
Admission: EM | Admit: 2015-05-27 | Discharge: 2015-05-28 | Disposition: A | Discharge status: Home / Self Care | Payer: BC Managed Care – PPO | Attending: Emergency Medicine | Admitting: Emergency Medicine

## 2015-05-27 ENCOUNTER — Encounter: Payer: Self-pay | Admitting: *Deleted

## 2015-05-27 ENCOUNTER — Encounter
Admission: RE | Disposition: A | Discharge status: Home / Self Care | Payer: Self-pay | Source: Ambulatory Visit | Attending: Obstetrics and Gynecology

## 2015-05-27 DIAGNOSIS — R000 Tachycardia, unspecified: Secondary | ICD-10-CM

## 2015-05-27 DIAGNOSIS — J45909 Unspecified asthma, uncomplicated: Secondary | ICD-10-CM | POA: Diagnosis not present

## 2015-05-27 DIAGNOSIS — N938 Other specified abnormal uterine and vaginal bleeding: Secondary | ICD-10-CM | POA: Diagnosis not present

## 2015-05-27 DIAGNOSIS — Z9851 Tubal ligation status: Secondary | ICD-10-CM | POA: Insufficient documentation

## 2015-05-27 DIAGNOSIS — R0682 Tachypnea, not elsewhere classified: Secondary | ICD-10-CM

## 2015-05-27 DIAGNOSIS — N946 Dysmenorrhea, unspecified: Secondary | ICD-10-CM | POA: Insufficient documentation

## 2015-05-27 DIAGNOSIS — N800 Endometriosis of uterus: Secondary | ICD-10-CM | POA: Insufficient documentation

## 2015-05-27 DIAGNOSIS — G8929 Other chronic pain: Secondary | ICD-10-CM | POA: Insufficient documentation

## 2015-05-27 DIAGNOSIS — D060 Carcinoma in situ of endocervix: Secondary | ICD-10-CM | POA: Insufficient documentation

## 2015-05-27 DIAGNOSIS — F419 Anxiety disorder, unspecified: Secondary | ICD-10-CM | POA: Diagnosis not present

## 2015-05-27 DIAGNOSIS — N871 Moderate cervical dysplasia: Secondary | ICD-10-CM | POA: Diagnosis present

## 2015-05-27 DIAGNOSIS — G8918 Other acute postprocedural pain: Secondary | ICD-10-CM

## 2015-05-27 DIAGNOSIS — N949 Unspecified condition associated with female genital organs and menstrual cycle: Secondary | ICD-10-CM | POA: Diagnosis not present

## 2015-05-27 DIAGNOSIS — R102 Pelvic and perineal pain: Secondary | ICD-10-CM | POA: Insufficient documentation

## 2015-05-27 DIAGNOSIS — N8003 Adenomyosis of the uterus: Secondary | ICD-10-CM

## 2015-05-27 DIAGNOSIS — R0600 Dyspnea, unspecified: Secondary | ICD-10-CM

## 2015-05-27 DIAGNOSIS — R079 Chest pain, unspecified: Secondary | ICD-10-CM

## 2015-05-27 DIAGNOSIS — N939 Abnormal uterine and vaginal bleeding, unspecified: Secondary | ICD-10-CM | POA: Insufficient documentation

## 2015-05-27 DIAGNOSIS — R87613 High grade squamous intraepithelial lesion on cytologic smear of cervix (HGSIL): Secondary | ICD-10-CM | POA: Diagnosis not present

## 2015-05-27 DIAGNOSIS — N736 Female pelvic peritoneal adhesions (postinfective): Secondary | ICD-10-CM | POA: Insufficient documentation

## 2015-05-27 DIAGNOSIS — F329 Major depressive disorder, single episode, unspecified: Secondary | ICD-10-CM | POA: Diagnosis not present

## 2015-05-27 DIAGNOSIS — Z8614 Personal history of Methicillin resistant Staphylococcus aureus infection: Secondary | ICD-10-CM | POA: Diagnosis not present

## 2015-05-27 HISTORY — DX: Personal history of Methicillin resistant Staphylococcus aureus infection: Z86.14

## 2015-05-27 HISTORY — DX: Endometriosis of uterus: N80.0

## 2015-05-27 HISTORY — DX: Unspecified asthma, uncomplicated: J45.909

## 2015-05-27 HISTORY — DX: Headache, unspecified: R51.9

## 2015-05-27 HISTORY — DX: Adenomyosis of the uterus: N80.03

## 2015-05-27 HISTORY — PX: HYSTEROSCOPY W/D&C: SHX1775

## 2015-05-27 HISTORY — PX: LEEP: SHX91

## 2015-05-27 HISTORY — DX: Headache: R51

## 2015-05-27 HISTORY — PX: LAPAROSCOPY: SHX197

## 2015-05-27 LAB — CBC WITH DIFFERENTIAL/PLATELET
BASOS PCT: 1 %
Basophils Absolute: 0 10*3/uL (ref 0–0.1)
EOS ABS: 0.1 10*3/uL (ref 0–0.7)
Eosinophils Relative: 2 %
HEMATOCRIT: 41.4 % (ref 35.0–47.0)
Hemoglobin: 14.2 g/dL (ref 12.0–16.0)
Lymphocytes Relative: 33 %
Lymphs Abs: 2 10*3/uL (ref 1.0–3.6)
MCH: 30.6 pg (ref 26.0–34.0)
MCHC: 34.3 g/dL (ref 32.0–36.0)
MCV: 89.3 fL (ref 80.0–100.0)
MONOS PCT: 8 %
Monocytes Absolute: 0.5 10*3/uL (ref 0.2–0.9)
NEUTROS ABS: 3.5 10*3/uL (ref 1.4–6.5)
NEUTROS PCT: 56 %
Platelets: 172 10*3/uL (ref 150–440)
RBC: 4.64 MIL/uL (ref 3.80–5.20)
RDW: 12.5 % (ref 11.5–14.5)
WBC: 6.2 10*3/uL (ref 3.6–11.0)

## 2015-05-27 LAB — RAPID HIV SCREEN (HIV 1/2 AB+AG)
HIV 1/2 ANTIBODIES: NONREACTIVE
HIV-1 P24 ANTIGEN - HIV24: NONREACTIVE

## 2015-05-27 LAB — POCT PREGNANCY, URINE: PREG TEST UR: NEGATIVE

## 2015-05-27 LAB — SURGICAL PCR SCREEN
MRSA, PCR: NEGATIVE
STAPHYLOCOCCUS AUREUS: NEGATIVE

## 2015-05-27 SURGERY — LEEP (LOOP ELECTROSURGICAL EXCISION PROCEDURE)
Anesthesia: General | Wound class: Clean Contaminated

## 2015-05-27 MED ORDER — MIDAZOLAM HCL 2 MG/2ML IJ SOLN
INTRAMUSCULAR | Status: DC | PRN
  Administered 2015-05-27: 2 mg via INTRAVENOUS

## 2015-05-27 MED ORDER — IPRATROPIUM-ALBUTEROL 0.5-2.5 (3) MG/3ML IN SOLN
RESPIRATORY_TRACT | Status: AC
  Filled 2015-05-27: qty 3

## 2015-05-27 MED ORDER — LIDOCAINE-EPINEPHRINE 1 %-1:100000 IJ SOLN
INTRAMUSCULAR | Status: DC | PRN
  Administered 2015-05-27: 18 mL

## 2015-05-27 MED ORDER — FENTANYL CITRATE (PF) 100 MCG/2ML IJ SOLN
INTRAMUSCULAR | Status: DC | PRN
  Administered 2015-05-27: 100 ug via INTRAVENOUS
  Administered 2015-05-27: 50 ug via INTRAVENOUS

## 2015-05-27 MED ORDER — DEXAMETHASONE SODIUM PHOSPHATE 10 MG/ML IJ SOLN
INTRAMUSCULAR | Status: DC | PRN
  Administered 2015-05-27: 10 mg via INTRAVENOUS

## 2015-05-27 MED ORDER — FENTANYL CITRATE (PF) 100 MCG/2ML IJ SOLN
25.0000 ug | INTRAMUSCULAR | Status: DC | PRN
Start: 2015-05-27 — End: 2015-05-27
  Administered 2015-05-27 (×2): 25 ug via INTRAVENOUS

## 2015-05-27 MED ORDER — FAMOTIDINE 20 MG PO TABS
20.0000 mg | ORAL_TABLET | Freq: Once | ORAL | Status: AC
  Administered 2015-05-27: 20 mg via ORAL

## 2015-05-27 MED ORDER — PROPOFOL 10 MG/ML IV BOLUS
INTRAVENOUS | Status: DC | PRN
  Administered 2015-05-27: 140 mg via INTRAVENOUS

## 2015-05-27 MED ORDER — IODINE STRONG (LUGOLS) 5 % PO SOLN
ORAL | Status: AC
  Filled 2015-05-27: qty 1

## 2015-05-27 MED ORDER — LIDOCAINE-EPINEPHRINE 1 %-1:100000 IJ SOLN
INTRAMUSCULAR | Status: AC
  Filled 2015-05-27: qty 1

## 2015-05-27 MED ORDER — IODINE STRONG (LUGOLS) 5 % PO SOLN
ORAL | Status: DC | PRN
  Administered 2015-05-27: 5 mL

## 2015-05-27 MED ORDER — SUGAMMADEX SODIUM 200 MG/2ML IV SOLN
INTRAVENOUS | Status: DC | PRN
  Administered 2015-05-27: 108.4 mg via INTRAVENOUS

## 2015-05-27 MED ORDER — OXYCODONE-ACETAMINOPHEN 5-325 MG PO TABS: 1.0000 | ORAL_TABLET | ORAL | Status: DC | PRN

## 2015-05-27 MED ORDER — PROMETHAZINE HCL 25 MG/ML IJ SOLN: 6.2500 mg | INTRAMUSCULAR | Status: DC | PRN

## 2015-05-27 MED ORDER — PHENYLEPHRINE HCL 10 MG/ML IJ SOLN
INTRAMUSCULAR | Status: DC | PRN
  Administered 2015-05-27: 100 ug via INTRAVENOUS

## 2015-05-27 MED ORDER — OXYCODONE HCL 5 MG PO TABS
5.0000 mg | ORAL_TABLET | Freq: Once | ORAL | Status: AC | PRN
  Administered 2015-05-27: 5 mg via ORAL

## 2015-05-27 MED ORDER — FENTANYL CITRATE (PF) 100 MCG/2ML IJ SOLN
INTRAMUSCULAR | Status: AC
  Administered 2015-05-27: 25 ug via INTRAVENOUS
  Filled 2015-05-27: qty 2

## 2015-05-27 MED ORDER — FAMOTIDINE 20 MG PO TABS
ORAL_TABLET | ORAL | Status: AC
  Filled 2015-05-27: qty 1

## 2015-05-27 MED ORDER — OXYCODONE HCL 5 MG PO TABS
ORAL_TABLET | ORAL | Status: AC
  Administered 2015-05-27: 5 mg via ORAL
  Filled 2015-05-27: qty 1

## 2015-05-27 MED ORDER — LACTATED RINGERS IV SOLN: INTRAVENOUS | Status: DC

## 2015-05-27 MED ORDER — ROCURONIUM BROMIDE 100 MG/10ML IV SOLN
INTRAVENOUS | Status: DC | PRN
  Administered 2015-05-27: 10 mg via INTRAVENOUS
  Administered 2015-05-27: 35 mg via INTRAVENOUS

## 2015-05-27 MED ORDER — NEOSTIGMINE METHYLSULFATE 10 MG/10ML IV SOLN
INTRAVENOUS | Status: DC | PRN
  Administered 2015-05-27: 4 mg via INTRAVENOUS
  Administered 2015-05-27: 1 mg via INTRAVENOUS

## 2015-05-27 MED ORDER — LIDOCAINE HCL (CARDIAC) 20 MG/ML IV SOLN
INTRAVENOUS | Status: DC | PRN
  Administered 2015-05-27: 40 mg via INTRAVENOUS

## 2015-05-27 MED ORDER — OXYCODONE HCL 5 MG/5ML PO SOLN: 5.0000 mg | Freq: Once | ORAL | Status: AC | PRN

## 2015-05-27 MED ORDER — FERRIC SUBSULFATE 259 MG/GM EX SOLN
CUTANEOUS | Status: DC | PRN
  Administered 2015-05-27: 1

## 2015-05-27 MED ORDER — ONDANSETRON HCL 4 MG/2ML IJ SOLN
INTRAMUSCULAR | Status: DC | PRN
  Administered 2015-05-27: 4 mg via INTRAVENOUS

## 2015-05-27 MED ORDER — LACTATED RINGERS IV SOLN
INTRAVENOUS | Status: DC
  Administered 2015-05-27 (×2): via INTRAVENOUS

## 2015-05-27 MED ORDER — IPRATROPIUM-ALBUTEROL 0.5-2.5 (3) MG/3ML IN SOLN: 3.0000 mL | Freq: Once | RESPIRATORY_TRACT | Status: DC

## 2015-05-27 MED ORDER — IBUPROFEN 800 MG PO TABS: 800.0000 mg | ORAL_TABLET | Freq: Three times a day (TID) | ORAL | Status: DC

## 2015-05-27 MED ORDER — GLYCOPYRROLATE 0.2 MG/ML IJ SOLN
INTRAMUSCULAR | Status: DC | PRN
  Administered 2015-05-27: .6 mg via INTRAVENOUS

## 2015-05-27 MED ORDER — FERRIC SUBSULFATE 259 MG/GM EX SOLN
CUTANEOUS | Status: AC
  Filled 2015-05-27: qty 8

## 2015-05-27 SURGICAL SUPPLY — 54 items
APPLICATOR COTTON TIP 6IN STRL (MISCELLANEOUS) ×2 IMPLANT
BLADE SURG SZ11 CARB STEEL (BLADE) ×2 IMPLANT
BNDG ADH 2 X3.75 FABRIC TAN LF (GAUZE/BANDAGES/DRESSINGS) IMPLANT
CANISTER SUCT 1200ML W/VALVE (MISCELLANEOUS) ×2 IMPLANT
CATH FOLEY 2WAY  5CC 16FR (CATHETERS) ×1
CATH ROBINSON RED A/P 16FR (CATHETERS) IMPLANT
CATH URTH 16FR FL 2W BLN LF (CATHETERS) ×1 IMPLANT
CHLORAPREP W/TINT 26ML (MISCELLANEOUS) ×2 IMPLANT
COVER LIGHT HANDLE STERIS (MISCELLANEOUS) ×4 IMPLANT
DEPRESSOR TONGUE BLADE STERILE (MISCELLANEOUS) IMPLANT
DRAPE UNDER BUTTOCK W/FLU (DRAPES) ×2 IMPLANT
DRSG TELFA 3X8 NADH (GAUZE/BANDAGES/DRESSINGS) ×2 IMPLANT
ELECT LEEP BALL 5MM 12CM (MISCELLANEOUS) ×2
ELECT LEEP LOOP 20X10 R2010 (MISCELLANEOUS) ×2
ELECT LOOP 1.0X1.0CM R1010 (MISCELLANEOUS) ×2
ELECTRODE LEEP BALL 5MM 12CM (MISCELLANEOUS) ×1 IMPLANT
ELECTRODE LEP LOOP 20X10 R2010 (MISCELLANEOUS) ×1 IMPLANT
ELECTRODE LOOP 1.0X1.0CM R1010 (MISCELLANEOUS) ×1 IMPLANT
GLOVE BIO SURGEON STRL SZ8 (GLOVE) ×16 IMPLANT
GLOVE INDICATOR 8.0 STRL GRN (GLOVE) ×2 IMPLANT
GOWN STRL REUS W/ TWL LRG LVL3 (GOWN DISPOSABLE) ×2 IMPLANT
GOWN STRL REUS W/ TWL XL LVL3 (GOWN DISPOSABLE) ×1 IMPLANT
GOWN STRL REUS W/TWL LRG LVL3 (GOWN DISPOSABLE) ×2
GOWN STRL REUS W/TWL XL LVL3 (GOWN DISPOSABLE) ×1
HANDLE YANKAUER SUCT BULB TIP (MISCELLANEOUS) ×2 IMPLANT
IRRIGATION STRYKERFLOW (MISCELLANEOUS) IMPLANT
IRRIGATOR STRYKERFLOW (MISCELLANEOUS)
IV LACTATED RINGERS 1000ML (IV SOLUTION) ×2 IMPLANT
KIT RM TURNOVER CYSTO AR (KITS) ×2 IMPLANT
LABEL OR SOLS (LABEL) ×2 IMPLANT
NEEDLE SPNL 22GX3.5 QUINCKE BK (NEEDLE) ×2 IMPLANT
NS IRRIG 1000ML POUR BTL (IV SOLUTION) ×2 IMPLANT
NS IRRIG 500ML POUR BTL (IV SOLUTION) ×2 IMPLANT
PACK DNC HYST (MISCELLANEOUS) ×2 IMPLANT
PACK GYN LAPAROSCOPIC (MISCELLANEOUS) ×2 IMPLANT
PAD GROUND ADULT SPLIT (MISCELLANEOUS) ×2 IMPLANT
PAD OB MATERNITY 4.3X12.25 (PERSONAL CARE ITEMS) ×2 IMPLANT
PAD PREP 24X41 OB/GYN DISP (PERSONAL CARE ITEMS) ×2 IMPLANT
PENCIL ELECTRO HAND CTR (MISCELLANEOUS) ×2 IMPLANT
POUCH ENDO CATCH 10MM SPEC (MISCELLANEOUS) IMPLANT
SCISSORS METZENBAUM CVD 33 (INSTRUMENTS) IMPLANT
SLEEVE ENDOPATH XCEL 5M (ENDOMECHANICALS) ×2 IMPLANT
SOL PREP PVP 2OZ (MISCELLANEOUS) ×2
SOLUTION PREP PVP 2OZ (MISCELLANEOUS) ×1 IMPLANT
STRAW SMOKE EVAC LEEP 6150 NON (MISCELLANEOUS) ×2 IMPLANT
SURGILUBE 2OZ TUBE FLIPTOP (MISCELLANEOUS) ×2 IMPLANT
SUT PLAIN 4 0 FS 2 27 (SUTURE) IMPLANT
SUT SILK 2 0 SH (SUTURE) ×2 IMPLANT
SUT VIC AB 0 CT2 27 (SUTURE) IMPLANT
SUT VIC AB 0 UR5 27 (SUTURE) ×2 IMPLANT
SYRINGE 10CC LL (SYRINGE) ×2 IMPLANT
TOWEL OR 17X26 4PK STRL BLUE (TOWEL DISPOSABLE) ×2 IMPLANT
TROCAR XCEL NON-BLD 5MMX100MML (ENDOMECHANICALS) ×2 IMPLANT
TUBING INSUFFLATOR HI FLOW (MISCELLANEOUS) ×2 IMPLANT

## 2015-05-27 NOTE — Op Note (Addendum)
OPERATIVE NOTE:  Carrie Rios PROCBrand Rios DATE: 05/27/2015   PREOPERATIVE DIAGNOSIS: 1. High-grade endocervical dysplasia 2. Abnormal uterine bleeding refractory to medical therapy 3. Chronic pelvic pain/dysmenorrhea  POSTOPERATIVE DIAGNOSIS:  1. High-grade endocervical dysplasia 2. Abnormal uterine bleeding refractory to medical therapy 3. Chronic pelvic pain/dysmenorrhea 4. Pelvic adhesions  PROCEDURE:  1. LEEP cone biopsy of cervix 2. Hysteroscopy/D&C 3. Laparoscopy with adhesiolysis and peritoneal biopsies   SURGEON:  Herold HarmsMartin A Gurtej Noyola, MD ASSISTANTS: None ANESTHESIA: General INDICATIONS: 24 y.o. G2P P2002, status post BTL for contraception, presents for surgical management of multiple issues as noted in the preoperative diagnoses``  FINDINGS:   1. Normal staining of the vagina and cervix with Lugol's solution 2. Secretory-type endometrium in a cavity without obvious gross lesions 3. Pelvic adhesions between the omentum and the anterior abdominal wall as well as between the omentum in the right fallopian tube (excised). Evidence of previous tubal ligation. Grossly normal ovaries. White lesion on left uterosacral ligament and posterior lower uterine segment areas were excised and cauterized. Random biopsy of grossly normal-appearing cul-de-sac. Bladder flap was normal.    I/O's: Total I/O In: -  Out: 50 [Urine:50] EBL: Minimal COUNTS:  YES SPECIMENS:  1. Ectocervical LEEP 2. Endocervical LEEP 3. Post LEEP ECC 4. Uterosacral ligament biopsy 5. Lower uterine segment biopsy 6. Cul-de-sac biopsy 7. Right fallopian tube adhesion biopsy  ANTIBIOTIC PROPHYLAXIS:N/A COMPLICATIONS: None immediate  PROCEDURE IN DETAIL: The patient's postoperative course was the supine position. General endotracheal anesthesia was induced without difficulty. She was placed in the dorsal lithotomy position using the bumblebee stirrups. A ChloraPrep and Betadine abdominal perineal  intravaginal prep and drape was performed in standard fashion. A latex free catheter was used to drain 50 mL of urine from the bladder. A coated speculum was placed in the vagina and the cervix was inserted vaginally infiltrated with 18 cc of 1% lidocaine with epinephrine 1 200,000. Lugol's solution was placed in the vagina and cervix revealed a normal staining pattern. The LEEP procedure was then performed in routine fashion with a wide loop removing the ectocervical LEEP. A narrower loop was used to remove the endocervical. Post LEEP ECC was performed with a serrated curette. The cone bed was then cauterized with rollerball cautery.  Hysteroscopy/D&C was then performed in standard fashion. The cervix was grasped with a single-tooth tenaculum. Cervix was dilated to 8 cm. Hanks dilators were used up to a #20 [place]French caliber to dilate the endocervical canal. The hysteroscope using lactated Ringer's as irrigant was placed to identify the intrauterine findings as noted above. This was followed by curettage with the smooth and serrated curettes producing secretory-type endometrial tissue. Following repeat stroboscopy which demonstrated adequate sampling, the procedure was terminated. All instrumentation was removed from the vagina. Patient was then reprepped for the laparoscopy.  Laparoscopic procedure was then performed through standard technique. A 5 mm incision was made in the subumbilical location and the Optiview laparoscopic trocar system was used to place a 5 mm port directly into the abdominal pelvic cavity without evidence of bowel or vascular  injury. A second 5 mm port was placed in the left lower quadrant under direct visualization. The above-noted findings were photo documented. The adhesiolysis was performed using a biopsy forceps to take down the right fallopian tube omental adhesions. Next biopsies of the ligament, lower uterine segment, and cul-de-sac were taken the biopsy forceps. These areas were  cauterized with Kleppinger bipolar forceps. Good hemostasis was obtained. Following inspection of the pelvis and upper abdomen  which revealed normal liver and gallbladder and appendix, procedure was terminated cavity. Pneumoperitoneum was released. The incisions were closed with 40 plain suture in a simple interrupted manner. Telfa and OpSite dressings were placed. Patient was then awakened and spent taking the recovery room in satisfactory condition.  Should this patient require hysterectomy, transvaginal hysterectomy would be recommended.   [name] A. [name], MD, [facility]

## 2015-05-27 NOTE — H&P (View-Only) (Signed)
Subjective: PREOPERATIVE HISTORY AND PHYSICAL  Date of surgery: 05/27/2015 Chief complaint: 1. High-grade dysplasia. 2. Dysfunctional uterine bleeding. 3. Dysmenorrhea.  24 year old white female, para 2002, status post BTL for contraception, presents for surgical management.  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 Patient does need to have LEEP cone biopsy for further assessment of this condition.  Patient has had dysfunctional uterine bleeding and has ultrasound demonstrating 10 mm endometrial stripe without pathologic abnormalities within the uterus. Expectant management versus dilation and curettage of the endometrium were discussed to help further assess and regulate her bleeding. She does desire to have the D&C at the time of her LEEP cone biopsy.  Because of the patient's significant dysmenorrhea, she is wishing to proceed with laparoscopy to rule out endometriosis as a possible etiology to this condition. She does understand that if endometriosis is identified, then ultimately hysterectomy may be successful in resolving pelvic pain, abnormal uterine bleeding, and cervical dysplasia.  Pertinent Gynecological History: Menses: Irregular Contraception: BTL   Menstrual History: OB History    Gravida Para Term Preterm AB TAB SAB Ectopic Multiple Living   2         2      Menarche age: na Patient's last menstrual period was 05/06/2015.    Past Medical History  Diagnosis Date  . Anxiety   . Depression   . HPV in female     Past Surgical History  Procedure Laterality Date  . Tubal ligation  2013    OB History  Gravida Para Term Preterm AB SAB TAB Ectopic Multiple Living  2         2    # Outcome Date GA Lbr Len/2nd Weight Sex Delivery Anes PTL Lv  2 Gravida 2013    M Vag-Spont   Y  1 Gravida 2010    F Vag-Spont   Y      Social History   Social History   . Marital Status: Married    Spouse Name: N/A  . Number of Children: N/A  . Years of Education: N/A   Social History Main Topics  . Smoking status: Never Smoker   . Smokeless tobacco: Never Used  . Alcohol Use: No  . Drug Use: No  . Sexual Activity: Yes    Birth Control/ Protection: Surgical   Other Topics Concern  . None   Social History Narrative    Family History  Problem Relation Age of Onset  . Breast cancer Maternal Grandmother   . Cancer Maternal Grandmother      (Not in a hospital admission)  No Known Allergies  Review of Systems Constitutional: No recent fever/chills/sweats Respiratory: No recent cough/bronchitis Cardiovascular: No chest pain Gastrointestinal: No recent nausea/vomiting/diarrhea Genitourinary: No UTI symptoms Hematologic/lymphatic:No history of coagulopathy or recent blood thinner use    Objective:    BP 96/63 mmHg  Pulse 94  Ht 5\' 1"  (1.549 m)  Wt 119 lb 6.4 oz (54.159 kg)  BMI 22.57 kg/m2  LMP 05/06/2015  General:   Normal  Skin:   normal  HEENT:  Normal  Neck:  Supple without Adenopathy or Thyromegaly  Lungs:   Heart:              Breasts:   Abdomen:  Pelvis:  M/S   Extremeties:  Neuro:    clear to auscultation bilaterally   Normal without murmur   Not Examined   soft,  non-tender; bowel sounds normal; no masses,  no organomegaly   Exam deferred to OR  No CVAT  Warm/Dry   Normal          Assessment:  1.  High-grade dysplasia. 2.  Abnormal uterine bleeding refractory to medical therapy. 3.  Chronic pelvic pain/dysmenorrhea, consistent with endometriosis  Plan:   1.  LEEP cone biopsy. 2.  Hysteroscopy/D&C. 3.  Laparoscopy with peritoneal biopsies.  Preoperative counseling: Patient is to undergo surgery on 05/27/2015.  She is understanding of the planned procedures and is aware of and is accepting of all surgical risks which include but are not limited to bleeding, infection, pelvic organ injury, need for  repair, blood clots disorders, anesthesia risks, etc.  All questions are answered.  Informed consent is given.  Patient is ready and willing to proceed with surgery as scheduled.  Chauna Osoria A Yanna Leaks, MD  Note: This dictation was prepared with Dragon dictation along with smaller phrase technology. Any transcriptional errors that result from this process are unintentional.  

## 2015-05-27 NOTE — Transfer of Care (Signed)
Immediate Anesthesia Transfer of Care Note  Patient: Carrie LoserSarah M Rios  Procedure(s) Performed: Procedure(s): LOOP ELECTROSURGICAL EXCISION PROCEDURE (LEEP); cone biopsy (N/A) DILATATION AND CURETTAGE /HYSTEROSCOPY (N/A) LAPAROSCOPY OPERATIVE, WITH ADHESIOLYSIS AND PERITONEAL BIOPSIES (N/A)  Patient Location: PACU  Anesthesia Type:General  Level of Consciousness: awake  Airway & Oxygen Therapy: Patient Spontanous Breathing and Patient connected to face mask oxygen  Post-op Assessment: Report given to RN and Post -op Vital signs reviewed and stable  Post vital signs: Reviewed and stable  Last Vitals:  Filed Vitals:   05/27/15 0935 05/27/15 0940  BP: 109/63   Pulse: 82 70  Temp: 36.2 C   Resp: 16 12    Complications: No apparent anesthesia complications

## 2015-05-27 NOTE — Anesthesia Postprocedure Evaluation (Signed)
Anesthesia Post Note  Patient: Carrie LoserSarah M Ouderkirk  Procedure(s) Performed: Procedure(s) (LRB): LOOP ELECTROSURGICAL EXCISION PROCEDURE (LEEP); cone biopsy (N/A) DILATATION AND CURETTAGE /HYSTEROSCOPY (N/A) LAPAROSCOPY OPERATIVE, WITH ADHESIOLYSIS AND PERITONEAL BIOPSIES (N/A)  Patient location during evaluation: PACU Anesthesia Type: General Level of consciousness: awake and alert Pain management: pain level controlled Vital Signs Assessment: post-procedure vital signs reviewed and stable Respiratory status: spontaneous breathing, nonlabored ventilation, respiratory function stable and patient connected to nasal cannula oxygen Cardiovascular status: blood pressure returned to baseline and stable Postop Assessment: no signs of nausea or vomiting Anesthetic complications: no    Last Vitals:  Filed Vitals:   05/27/15 1017 05/27/15 1036  BP: 92/52 98/54  Pulse: 59 63  Temp:  36.2 C  Resp: 11 14    Last Pain:  Filed Vitals:   05/27/15 1037  PainSc: 2                  Cleda MccreedyJoseph K Piscitello

## 2015-05-27 NOTE — Interval H&P Note (Signed)
History and Physical Interval Note:  05/27/2015 7:35 AM  Carrie LoserSarah M Rios  has presented today for surgery, with the diagnosis of CERVICALO DYSPLASIA, DYSMENORRHEA, DUB  The various methods of treatment have been discussed with the patient and family. After consideration of risks, benefits and other options for treatment, the patient has consented to  Procedure(s): LOOP ELECTROSURGICAL EXCISION PROCEDURE (LEEP) (N/A) DILATATION AND CURETTAGE /HYSTEROSCOPY (N/A) LAPAROSCOPY OPERATIVE (N/A) as a surgical intervention .  The patient's history has been reviewed, patient examined, no change in status, stable for surgery.  I have reviewed the patient's chart and labs.  Questions were answered to the patient's satisfaction.     Daphine DeutscherMartin A Dalanie Kisner

## 2015-05-27 NOTE — ED Notes (Signed)
Patient had laproscopy/D and C done here today. This evening patient states began with squeezing in arms and legs, then SOB with pain to right side and right breast.

## 2015-05-27 NOTE — Anesthesia Preprocedure Evaluation (Signed)
Anesthesia Evaluation  Patient identified by MRN, date of birth, ID band Patient awake    Reviewed: Allergy & Precautions, H&P , NPO status , Patient's Chart, lab work & pertinent test results  History of Anesthesia Complications Negative for: history of anesthetic complications  Airway Mallampati: II  TM Distance: >3 FB Neck ROM: full    Dental no notable dental hx. (+) Teeth Intact   Pulmonary neg shortness of breath, asthma ,    Pulmonary exam normal breath sounds clear to auscultation       Cardiovascular Exercise Tolerance: Good (-) angina(-) Past MI and (-) DOE negative cardio ROS Normal cardiovascular exam Rhythm:regular Rate:Normal     Neuro/Psych  Headaches, PSYCHIATRIC DISORDERS Anxiety Depression    GI/Hepatic negative GI ROS, Neg liver ROS, neg GERD  ,  Endo/Other  negative endocrine ROS  Renal/GU negative Renal ROS  negative genitourinary   Musculoskeletal   Abdominal   Peds  Hematology negative hematology ROS (+)   Anesthesia Other Findings Past Medical History:   Anxiety                                                      Depression                                                   HPV in female                                                Headache                                                       Comment:MIGRAINES   Asthma                                                         Comment:WELL-CONTROLLED-HAS INHALER BUT IT IS NOT HERS   Hx MRSA infection                               2009        Past Surgical History:   TUBAL LIGATION                                   2013           Reproductive/Obstetrics negative OB ROS                             Anesthesia Physical Anesthesia Plan  ASA: III  Anesthesia Plan: General ETT   Post-op Pain Management:  Induction:   Airway Management Planned:   Additional Equipment:   Intra-op Plan:    Post-operative Plan:   Informed Consent: I have reviewed the patients History and Physical, chart, labs and discussed the procedure including the risks, benefits and alternatives for the proposed anesthesia with the patient or authorized representative who has indicated his/her understanding and acceptance.   Dental Advisory Given  Plan Discussed with: Anesthesiologist, CRNA and Surgeon  Anesthesia Plan Comments:         Anesthesia Quick Evaluation

## 2015-05-28 ENCOUNTER — Emergency Department: Payer: BC Managed Care – PPO

## 2015-05-28 ENCOUNTER — Encounter: Payer: Self-pay | Admitting: Obstetrics and Gynecology

## 2015-05-28 LAB — DIFFERENTIAL
BASOS PCT: 0 %
BASOS PCT: 0 %
Basophils Absolute: 0 10*3/uL (ref 0–0.1)
EOS ABS: 0 10*3/uL (ref 0–0.7)
EOS PCT: 0 %
EOS PCT: 0 %
LYMPHS PCT: 8 %
LYMPHS PCT: 8 %
Lymphs Abs: 1.1 10*3/uL (ref 1.0–3.6)
MONO ABS: 0.6 10*3/uL (ref 0.2–0.9)
MONOS PCT: 5 %
Monocytes Relative: 5 %
NEUTROS PCT: 87 %
Neutro Abs: 11.1 10*3/uL — ABNORMAL HIGH (ref 1.4–6.5)
Neutrophils Relative %: 87 %

## 2015-05-28 LAB — HEPATIC FUNCTION PANEL
ALK PHOS: 33 U/L — AB (ref 38–126)
ALT: 21 U/L (ref 14–54)
AST: 22 U/L (ref 15–41)
Albumin: 3.8 g/dL (ref 3.5–5.0)
BILIRUBIN DIRECT: 0.1 mg/dL (ref 0.1–0.5)
BILIRUBIN INDIRECT: 0.9 mg/dL (ref 0.3–0.9)
BILIRUBIN TOTAL: 1 mg/dL (ref 0.3–1.2)
TOTAL PROTEIN: 6.9 g/dL (ref 6.5–8.1)

## 2015-05-28 LAB — BASIC METABOLIC PANEL
Anion gap: 8 (ref 5–15)
BUN: 9 mg/dL (ref 6–20)
CHLORIDE: 110 mmol/L (ref 101–111)
CO2: 20 mmol/L — ABNORMAL LOW (ref 22–32)
CREATININE: 0.82 mg/dL (ref 0.44–1.00)
Calcium: 8.8 mg/dL — ABNORMAL LOW (ref 8.9–10.3)
GFR calc Af Amer: >60 mL/min (ref >60)
GFR calc non Af Amer: >60 mL/min (ref >60)
GLUCOSE: 140 mg/dL — AB (ref 65–99)
Potassium: 3.5 mmol/L (ref 3.5–5.1)
SODIUM: 138 mmol/L (ref 135–145)

## 2015-05-28 LAB — TROPONIN I: Troponin I: <0.03 ng/mL (ref <0.031)

## 2015-05-28 LAB — CBC
HEMATOCRIT: 38.7 % (ref 35.0–47.0)
Hemoglobin: 13.2 g/dL (ref 12.0–16.0)
MCH: 30.2 pg (ref 26.0–34.0)
MCHC: 34.1 g/dL (ref 32.0–36.0)
MCV: 88.7 fL (ref 80.0–100.0)
PLATELETS: 180 10*3/uL (ref 150–440)
RBC: 4.36 MIL/uL (ref 3.80–5.20)
RDW: 12.4 % (ref 11.5–14.5)
WBC: 12.7 10*3/uL — AB (ref 3.6–11.0)

## 2015-05-28 LAB — LIPASE, BLOOD: LIPASE: 26 U/L (ref 11–51)

## 2015-05-28 LAB — SURGICAL PATHOLOGY

## 2015-05-28 LAB — FIBRIN DERIVATIVES D-DIMER (ARMC ONLY): Fibrin derivatives D-dimer (ARMC): 3033 — ABNORMAL HIGH (ref 0–499)

## 2015-05-28 LAB — RPR: RPR Ser Ql: NONREACTIVE

## 2015-05-28 MED ORDER — MORPHINE SULFATE (PF) 2 MG/ML IV SOLN
2.0000 mg | Freq: Once | INTRAVENOUS | Status: AC
  Administered 2015-05-28: 2 mg via INTRAVENOUS
  Filled 2015-05-28: qty 1

## 2015-05-28 MED ORDER — IOHEXOL 350 MG/ML SOLN
75.0000 mL | Freq: Once | INTRAVENOUS | Status: AC | PRN
  Administered 2015-05-28: 75 mL via INTRAVENOUS

## 2015-05-28 MED ORDER — ONDANSETRON HCL 4 MG/2ML IJ SOLN
4.0000 mg | Freq: Once | INTRAMUSCULAR | Status: AC
  Administered 2015-05-28: 4 mg via INTRAVENOUS
  Filled 2015-05-28: qty 2

## 2015-05-28 NOTE — ED Notes (Signed)
Patient transported to CT 

## 2015-05-28 NOTE — ED Notes (Signed)
Spoke with lab, they requested to change lab orders. Orders changed.

## 2015-05-28 NOTE — ED Provider Notes (Signed)
[facility] Emergency Department Provider Note  ____________________________________________  Time seen: 11:30 PM  I have reviewed the triage vital signs and the nursing notes.   HISTORY  Chief Complaint No chief complaint on file.     HPI Carrie Rios is a 24 y.o. female presents with right-sided chest pain onset today after undergoing a laparoscopic surgery and D&C for dysfunctional uterine bleeding performed by Dr. Greggory Keenefrancesco. Patient states following the procedure on arrival home she started experiencing right lateral chest pain that radiates to the back. Patient states that the pain is worse with deep inspiration. In addition the patient admits to bilateral arms and legs squeezing sensation and cramping. Patient's husband states that she was breathing rapidly at the time of the bilateral arm and leg pain. Patient denies any history of DVT or PE. Current pain score 7 out of 10.  Past Medical History  Diagnosis Date  . Anxiety   . Depression   . HPV in female   . Headache     MIGRAINES  . Asthma     WELL-CONTROLLED-HAS INHALER BUT IT IS NOT HERS  . Hx MRSA infection 2009    Patient Active Problem List   Diagnosis Date Noted  . Dysfunctional uterine bleeding 04/25/2015  . Dysmenorrhea 04/25/2015  . Chronic pelvic pain in female 04/25/2015  . HGSIL on Pap smear of cervix 04/25/2015  . Frequent UTI 03/28/2014    Past Surgical History  Procedure Laterality Date  . Tubal ligation  2013  . Leep N/A 05/27/2015    Procedure: LOOP ELECTROSURGICAL EXCISION PROCEDURE (LEEP); cone biopsy;  Surgeon: Herold HarmsMartin A Defrancesco, MD;  Location: [facility] ORS;  Service: Gynecology;  Laterality: N/A;  . Hysteroscopy w/d&c N/A 05/27/2015    Procedure: DILATATION AND CURETTAGE /HYSTEROSCOPY;  Surgeon: Herold HarmsMartin A Defrancesco, MD;  Location: [facility] ORS;  Service: Gynecology;  Laterality: N/A;  . Laparoscopy N/A 05/27/2015    Procedure: LAPAROSCOPY OPERATIVE, WITH ADHESIOLYSIS  AND PERITONEAL BIOPSIES;  Surgeon: Herold HarmsMartin A Defrancesco, MD;  Location: [facility] ORS;  Service: Gynecology;  Laterality: N/A;    Current Outpatient Rx  Name  Route  Sig  Dispense  Refill  . aspirin-acetaminophen-caffeine (EXCEDRIN MIGRAINE) 250-250-65 MG tablet   Oral   Take 1 tablet by mouth every 6 (six) hours as needed for headache or migraine.         Marland Kitchen. ibuprofen (ADVIL,MOTRIN) 800 MG tablet   Oral   Take 1 tablet (800 mg total) by mouth 3 (three) times daily.   30 tablet   1   . Multiple Vitamin (MULTIVITAMIN) tablet   Oral   Take 1 tablet by mouth daily.         Marland Kitchen. oxyCODONE-acetaminophen (ROXICET) 5-325 MG tablet   Oral   Take 1-2 tablets by mouth every 4 (four) hours as needed for moderate pain or severe pain.   30 tablet   0     Allergies Latex  Family History  Problem Relation Age of Onset  . Breast cancer Maternal Grandmother   . Cancer Maternal Grandmother     Social History Social History  Substance Use Topics  . Smoking status: Never Smoker   . Smokeless tobacco: Never Used  . Alcohol Use: Yes     Comment: SOCIAL    Review of Systems  Constitutional: Negative for fever. Eyes: Negative for visual changes. ENT: Negative for sore throat. Cardiovascular: Positive for chest pain. Respiratory: Positive for shortness of breath. Gastrointestinal: Negative for abdominal pain, vomiting and diarrhea.  Genitourinary: Negative for dysuria. Musculoskeletal: Negative for back pain. Skin: Negative for rash. Neurological: Negative for headaches, focal weakness or numbness.   10-point ROS otherwise negative.  ____________________________________________   PHYSICAL EXAM:  VITAL SIGNS: ED Triage Vitals  Enc Vitals Group     BP 05/27/15 2353 114/77 mmHg     Pulse Rate 05/27/15 2353 88     Resp 05/28/15 0030 14     Temp 05/27/15 2353 98.3 F (36.8 C)     Temp Source 05/27/15 2353 Oral     SpO2 05/27/15 2353 100 %     Weight 05/27/15 2353 119 lb  (53.978 kg)     Height 05/27/15 2353  (1.549 m)     Head Cir --      Peak Flow --      Pain Score 05/27/15 2357 7     Pain Loc --      Pain Edu? --      Excl. in GC? --    Constitutional: Alert and oriented. Apparent discomfort Eyes: Conjunctivae are normal. PERRL. Normal extraocular movements. ENT   Head: Normocephalic and atraumatic.   Nose: No congestion/rhinnorhea.   Mouth/Throat: Mucous membranes are moist.   Neck: No stridor. Hematological/Lymphatic/Immunilogical: No cervical lymphadenopathy. Cardiovascular: Normal rate, regular rhythm. Normal and symmetric distal pulses are present in all extremities. No murmurs, rubs, or gallops. Respiratory: Normal respiratory effort without tachypnea nor retractions. Breath sounds are clear and equal bilaterally. No wheezes/rales/rhonchi. Gastrointestinal: Soft and nontender. No distention. There is no CVA tenderness. Genitourinary: deferred Musculoskeletal: Nontender with normal range of motion in all extremities. No joint effusions.  No lower extremity tenderness nor edema. Neurologic:  Normal speech and language. No gross focal neurologic deficits are appreciated. Speech is normal.  Skin:  Skin is warm, dry and intact. No rash noted. Psychiatric: Mood and affect are normal. Speech and behavior are normal. Patient exhibits appropriate insight and judgment.  ____________________________________________    LABS (pertinent positives/negatives)  Labs Reviewed  BASIC METABOLIC PANEL - Abnormal; Notable for the following:    CO2 20 (*)    Glucose, Bld 140 (*)    Calcium 8.8 (*)    All other components within normal limits  CBC - Abnormal; Notable for the following:    WBC 12.7 (*)    All other components within normal limits  FIBRIN DERIVATIVES D-DIMER ([facility] ONLY) - Abnormal; Notable for the following:    Fibrin derivatives D-dimer [facility]) 3033 (*)    All other components within normal limits  HEPATIC FUNCTION PANEL -  Abnormal; Notable for the following:    Alkaline Phosphatase 33 (*)    All other components within normal limits  DIFFERENTIAL - Abnormal; Notable for the following:    Neutro Abs 11.1 (*)    All other components within normal limits  TROPONIN I  LIPASE, BLOOD  DIFFERENTIAL     RADIOLOGY    CT Angio Chest PE W/Cm &/Or Wo Cm (Final result) Result time: 05/28/15 03:04:42   Final result by Rad Results In Interface (05/28/15 03:04:42)   Narrative:   CLINICAL DATA: Laparoscopy with D and C done here today. Shortness of breath and right-sided chest pain today.  EXAM: CT ANGIOGRAPHY CHEST WITH CONTRAST  TECHNIQUE: Multidetector CT imaging of the chest was performed using the standard protocol during bolus administration of intravenous contrast. Multiplanar CT image reconstructions and MIPs were obtained to evaluate the vascular anatomy.  CONTRAST: 75mL OMNIPAQUE IOHEXOL 350 MG/ML SOLN  COMPARISON: None.  FINDINGS:  Technically adequate study with good opacification of the central and segmental pulmonary arteries. No focal filling defects demonstrated. No evidence of significant pulmonary embolus.  Normal heart size. Normal caliber thoracic aorta. No evidence of aortic dissection. Great vessel origins are patent. No significant lymphadenopathy in the chest. Esophagus is decompressed.  No focal airspace disease or consolidation in the lungs. No pleural effusions. No pneumothorax. Airways appear patent.  Included portions of the upper abdominal organs demonstrate free air in the peritoneum, likely postoperative given history of recent laparoscopy. No destructive bone lesions.  Review of the MIP images confirms the above findings.  IMPRESSION: No evidence of significant pulmonary embolus. No evidence of active pulmonary disease. Free intraperitoneal air is likely postoperative.   Electronically Signed By: Burman Nieves M.D. On: 05/28/2015 03:04           DG Chest Port 1 View (Final result) Result time: 05/28/15 01:49:07   Final result by Rad Results In Interface (05/28/15 01:49:07)   Narrative:   CLINICAL DATA: Laparoscopy and D and C done here today. Developed pain this evening. Shortness of breath with pain to the right side and right breast.  EXAM: PORTABLE CHEST 1 VIEW  COMPARISON: None.  FINDINGS: Normal heart size and pulmonary vascularity. No focal airspace disease or consolidation in the lungs. No blunting of costophrenic angles. No pneumothorax. Mediastinal contours appear intact. There is free air under the hemidiaphragms. This is likely postoperative given the history of recent laparoscopy.  IMPRESSION: Free air under the hemidiaphragms likely postoperative given recent laparoscopy. No evidence of active pulmonary disease.   Electronically Signed By: Burman Nieves M.D. On: 05/28/2015 01:49         INITIAL IMPRESSION / ASSESSMENT AND PLAN / ED COURSE  Pertinent labs & imaging results that were available during my care of the patient were reviewed by me and considered in my medical decision making (see chart for details).  History of physical exam consistent with postoperative pain most secondary to free intraperitoneal air with diaphragmatic irritation of the right. CT scan revealed no evidence of PE laboratory data reassuring with the exception of an elevated d-dimer which is most likely artifactually high secondary to postop  ____________________________________________   FINAL CLINICAL IMPRESSION(S) / ED DIAGNOSES  Final diagnoses:  Tachypnea  Dyspnea  Tachycardia  Chest pain  Postoperative pain      Darci Current, MD 05/28/15 2353

## 2015-05-28 NOTE — ED Notes (Signed)
Patient returned from CT

## 2015-06-03 ENCOUNTER — Encounter: Payer: Self-pay | Admitting: Obstetrics and Gynecology

## 2015-06-05 ENCOUNTER — Ambulatory Visit: Payer: BC Managed Care – PPO | Admitting: Obstetrics and Gynecology

## 2015-06-06 ENCOUNTER — Encounter: Payer: Self-pay | Admitting: Obstetrics and Gynecology

## 2015-06-06 ENCOUNTER — Ambulatory Visit (INDEPENDENT_AMBULATORY_CARE_PROVIDER_SITE_OTHER): Payer: BC Managed Care – PPO | Admitting: Obstetrics and Gynecology

## 2015-06-06 VITALS — BP 91/54 | HR 81 | Ht 61.0 in | Wt 120.6 lb

## 2015-06-06 DIAGNOSIS — G8929 Other chronic pain: Secondary | ICD-10-CM

## 2015-06-06 DIAGNOSIS — N809 Endometriosis, unspecified: Secondary | ICD-10-CM

## 2015-06-06 DIAGNOSIS — N800 Endometriosis of uterus: Secondary | ICD-10-CM

## 2015-06-06 DIAGNOSIS — N871 Moderate cervical dysplasia: Secondary | ICD-10-CM

## 2015-06-06 DIAGNOSIS — N949 Unspecified condition associated with female genital organs and menstrual cycle: Secondary | ICD-10-CM

## 2015-06-06 DIAGNOSIS — N8003 Adenomyosis of the uterus: Secondary | ICD-10-CM

## 2015-06-06 DIAGNOSIS — R102 Pelvic and perineal pain: Secondary | ICD-10-CM

## 2015-06-06 DIAGNOSIS — N938 Other specified abnormal uterine and vaginal bleeding: Secondary | ICD-10-CM

## 2015-06-06 DIAGNOSIS — N946 Dysmenorrhea, unspecified: Secondary | ICD-10-CM

## 2015-06-06 NOTE — Progress Notes (Signed)
Chief complaint: 1.  One week postop check. 2.  Status post LEEP cone biopsy for CIN-3. 3.  Status post hysteroscopy/D&C for menorrhagia. 4.  Status post laparoscopy with biopsies for evaluation of chronic pelvic pain.  Patient reports for her one-week postop check.  She had minimal spotting for the first several days but has then developed.  Like bleeding.  She is not experiencing any fever, chills, sweats.  Pathology: LEEP cone biopsy-CIN-3, completely excised; post LEEP ECC negative. Hysteroscopy/D&C-benign secretory endometrium without evidence of hyperplasia or carcinoma. Laparoscopy with peritoneal biopsies-adenomyosis  OBJECTIVE: BP 91/54 mmHg  Pulse 81  Ht 5\' 1"  (1.549 m)  Wt 120 lb 9.6 oz (54.704 kg)  BMI 22.80 kg/m2  LMP 05/06/2015 Pleasant female in no acute distress. Abdomen-soft, nontender. Pelvic exam- External genitalia normal BUS normal Vagina with minimal blood in vault Cervix minimal blood in the cone bed without active bleeding; normal healing response.  ASSESSMENT: 1.  One week.  Normal postop check. 2.  Status post LEEP cone biopsy with complete excision of severe dysplasia. 3.  Status post hysteroscopy/D&C with normal endometrial cavity and normal findings on pathology. 4.  Status post laparoscopy with findings of adenomyosis, which explain patient's abnormal uterine bleeding and pelvic pain.  PLAN: 1.  Continue with routine postoperative precautions. 2.  No intercourse for 4 weeks. 3.  Monitor symptomatology over the next 3 months.  Regarding bleeding pattern and pain. 4.  Return in 3 months for further management of abnormal uterine bleeding and pelvic pain.  Patient is not interested in starting birth control pills at this time for endometriosis therapy.  She does understand that more aggressive hormonal therapy is available or definitive surgery.  3.  Hysterectomy is also an option.  Herold HarmsMartin A Odies Desa, MD  Note: This dictation was prepared with  Dragon dictation along with smaller phrase technology. Any transcriptional errors that result from this process are unintentional.

## 2015-06-06 NOTE — Patient Instructions (Signed)
1.  Monitor for abnormal uterine bleeding. 2.  Tylenol and ibuprofen as needed for pain. 3.  Return in 3 months for follow-up

## 2015-06-21 ENCOUNTER — Telehealth: Payer: Self-pay | Admitting: Obstetrics and Gynecology

## 2015-06-21 NOTE — Telephone Encounter (Signed)
PT HAD A PROCEDURE DONE BY DR DE AND HAS NOT REALLY STOPPED BLEEDING AND WANTED A CALL BACK TO KNOW IF THIS IS SOMETHING SHE NEEDS TO WORRY ABOUT OR NOT.

## 2015-06-21 NOTE — Telephone Encounter (Signed)
Pt is 3 weeks s/p leep, hysteroscopy and d&c. Since leep she has been spotting/bleeding. She is changing every 2 hours. NO cramps. Pt advised to monitor. If soaking a pad every 30 min to 1 hour or having cramps not relieved with ibup she will need to be seen sooner than f/u appt in 08/2015.

## 2015-07-22 ENCOUNTER — Telehealth: Payer: Self-pay | Admitting: Physician Assistant

## 2015-07-22 NOTE — Telephone Encounter (Signed)
Printed immunization record and placed up front. Advised patient and left up front for patient to pick up.

## 2015-07-22 NOTE — Telephone Encounter (Signed)
This was a Debbie pt and would like to pick up a copy of her shot record. Please advise pt when it is ready for pick up. Thanks TNP

## 2015-08-19 ENCOUNTER — Other Ambulatory Visit: Payer: Self-pay | Admitting: Family Medicine

## 2015-08-19 ENCOUNTER — Telehealth: Payer: Self-pay | Admitting: Obstetrics and Gynecology

## 2015-08-19 DIAGNOSIS — Z0184 Encounter for antibody response examination: Secondary | ICD-10-CM

## 2015-08-19 NOTE — Telephone Encounter (Signed)
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  Pt is asking if she can get a lab slip to have the varicella titer test.  ON#[phone]/[facility]#[phone]/MW

## 2015-08-19 NOTE — Telephone Encounter (Signed)
Pt has been notified. KW

## 2015-08-19 NOTE — Telephone Encounter (Signed)
Lab slip up front for pickup. 

## 2015-08-23 ENCOUNTER — Other Ambulatory Visit: Payer: Self-pay | Admitting: Family Medicine

## 2015-08-24 LAB — VARICELLA ZOSTER ANTIBODY, IGG: VARICELLA: 1411 {index} (ref >165)

## 2015-08-26 ENCOUNTER — Telehealth: Payer: Self-pay

## 2015-08-26 NOTE — Telephone Encounter (Signed)
Patient was advised. KW 

## 2015-08-26 NOTE — Telephone Encounter (Signed)
-----   Message from Anola Gurneyobert Chauvin, [place] sent at 08/26/2015  7:48 AM EDT ----- Your lab is consistent with immunity. Copy to the patient.

## 2015-09-03 ENCOUNTER — Ambulatory Visit (INDEPENDENT_AMBULATORY_CARE_PROVIDER_SITE_OTHER): Payer: BC Managed Care – PPO | Admitting: Obstetrics and Gynecology

## 2015-09-03 ENCOUNTER — Encounter: Payer: Self-pay | Admitting: Obstetrics and Gynecology

## 2015-09-03 VITALS — BP 103/70 | HR 88 | Ht 61.0 in | Wt 121.0 lb

## 2015-09-03 DIAGNOSIS — N800 Endometriosis of uterus: Secondary | ICD-10-CM | POA: Diagnosis not present

## 2015-09-03 DIAGNOSIS — N809 Endometriosis, unspecified: Secondary | ICD-10-CM

## 2015-09-03 DIAGNOSIS — R102 Pelvic and perineal pain: Secondary | ICD-10-CM | POA: Diagnosis not present

## 2015-09-03 DIAGNOSIS — N8003 Adenomyosis of the uterus: Secondary | ICD-10-CM

## 2015-09-03 DIAGNOSIS — N871 Moderate cervical dysplasia: Secondary | ICD-10-CM | POA: Diagnosis not present

## 2015-09-03 DIAGNOSIS — N946 Dysmenorrhea, unspecified: Secondary | ICD-10-CM | POA: Diagnosis not present

## 2015-09-03 NOTE — Progress Notes (Signed)
GYN ENCOUNTER NOTE  Subjective:       Carrie Rios is a 25 y.o. G2P0 female is here for gynecologic evaluation of the following issues:  1. Pelvic pain in female 2. Adenomyosis 3. Cervical dysplasia, moderate 4. Dysmenorrhea  Patient presents post-LEEP and D&C with complaints of premenstrual abdominal cramping, severe dysmenorrhea, and dyspareunia. Pain is unchanged from prior to LEEP procedure. Patient desires hysterectomy.  Gynecologic History Patient's last menstrual period was 07/30/2015 (approximate). Contraception: tubal ligation Last Pap: 2016. Results were: abnormal Last mammogram: none. Results were: N/A  Obstetric History OB History  Gravida Para Term Preterm AB SAB TAB Ectopic Multiple Living  2         2    # Outcome Date GA Lbr Len/2nd Weight Sex Delivery Anes PTL Lv  2 Gravida 2013    M Vag-Spont   Y  1 Gravida 2010    F Vag-Spont   Y      Past Medical History  Diagnosis Date  . Anxiety   . Depression   . HPV in female   . Headache     MIGRAINES  . Asthma     WELL-CONTROLLED-HAS INHALER BUT IT IS NOT HERS  . Hx MRSA infection 2009  . Adenomyosis of uterus determined by biopsy     Biopsy confirmed, uterine lower uterine segment    Past Surgical History  Procedure Laterality Date  . Tubal ligation  2013  . Leep N/A 05/27/2015    Procedure: LOOP ELECTROSURGICAL EXCISION PROCEDURE (LEEP); cone biopsy;  Surgeon: Herold HarmsMartin A Defrancesco, MD;  Location: [facility] ORS;  Service: Gynecology;  Laterality: N/A;  . Hysteroscopy w/d&c N/A 05/27/2015    Procedure: DILATATION AND CURETTAGE /HYSTEROSCOPY;  Surgeon: Herold HarmsMartin A Defrancesco, MD;  Location: [facility] ORS;  Service: Gynecology;  Laterality: N/A;  . Laparoscopy N/A 05/27/2015    Procedure: LAPAROSCOPY OPERATIVE, WITH ADHESIOLYSIS AND PERITONEAL BIOPSIES;  Surgeon: Herold HarmsMartin A Defrancesco, MD;  Location: [facility] ORS;  Service: Gynecology;  Laterality: N/A;    Current Outpatient Prescriptions on File Prior to Visit   Medication Sig Dispense Refill  . aspirin-acetaminophen-caffeine (EXCEDRIN MIGRAINE) 250-250-65 MG tablet Take 1 tablet by mouth every 6 (six) hours as needed for headache or migraine.    . Multiple Vitamin (MULTIVITAMIN) tablet Take 1 tablet by mouth daily.    Marland Kitchen. ibuprofen (ADVIL,MOTRIN) 800 MG tablet Take 1 tablet (800 mg total) by mouth 3 (three) times daily. 30 tablet 1   No current facility-administered medications on file prior to visit.    Allergies  Allergen Reactions  . Latex Rash    Social History   Social History  . Marital Status: Married    Spouse Name: N/A  . Number of Children: N/A  . Years of Education: N/A   Occupational History  . Not on file.   Social History Main Topics  . Smoking status: Never Smoker   . Smokeless tobacco: Never Used  . Alcohol Use: Yes     Comment: SOCIAL  . Drug Use: No  . Sexual Activity: Yes    Birth Control/ Protection: Surgical   Other Topics Concern  . Not on file   Social History Narrative    Family History  Problem Relation Age of Onset  . Breast cancer Maternal Grandmother   . Cancer Maternal Grandmother     The following portions of the patient's history were reviewed and updated as appropriate: allergies, current medications, past family history, past medical history, past social history, past surgical history  and problem list.  Review of Systems Review of Systems - General ROS: negative for - chills, fatigue, fever, hot flashes, malaise or night sweats. Positive for frequent, chronic headaches. Hematological and Lymphatic ROS: negative for - bleeding problems or swollen lymph nodes Gastrointestinal ROS: negative for - abdominal pain, blood in stools, change in bowel habits and nausea/vomiting Musculoskeletal ROS: negative for - joint pain, muscle pain or muscular weakness Genito-Urinary ROS: negative for - change in menstrual cycle, dyspareunia, dysuria, genital discharge, genital ulcers, hematuria, incontinence,  irregular/heavy menses, nocturia or pelvic pain. Positive for dysmenorrhea, abdominal pain, dyspareunia.  Objective:   BP 103/70 mmHg  Pulse 88  Ht  (1.549 m)  Wt 121 lb (54.885 kg)  BMI 22.87 kg/m2  LMP 07/30/2015 (Approximate) CONSTITUTIONAL: Well-developed, well-nourished female in no acute distress.  PELVIC:  External Genitalia: Normal  BUS: Normal  Vagina: Normal  Cervix: Normal; no cervical motion tenderness  Uterus: Retroverted; posterior nodularity and right-sided pain on palpation. Normal size, shape, consistency, mobility  Adnexa: Normal; right lower quadrant tenderness 1/4 without obvious mass  RV: Normal ; normal external exam  Bladder: Nontender    Assessment:   1. Pelvic pain in female 2. Adenomyosis 3. Cervical dysplasia, moderate 4. Dysmenorrhea   Plan:   Discussed treatment options, patient desires therapeutic hysterectomy.  Schedule patient for laparoscopic assisted trans-vaginal hysterectomy with right salpingo-ophorectomy in July 2017. Patient does not want oral contraceptives or other prescription for pain symptoms in the interim before surgery.  A total of 15 minutes were spent face-to-face with the patient during this encounter and over half of that time dealt with counseling and coordination of care.   Octavia Heir, PA-S Herold Harms, MD   I have seen, interviewed, and examined the patient in conjunction with the [facility].A. student and affirm the diagnosis and management plan. Martin A. DeFrancesco, MD, FACOG   Note: This dictation was prepared with Dragon dictation along with smaller phrase technology. Any transcriptional errors that result from this process are unintentional.

## 2015-09-03 NOTE — Patient Instructions (Signed)
1. Schedule [facility] bilateral salpingectomy and right oophorectomy for 01/06/2016 2. Return for preoperative appointment 01/02/2016 3. Tylenol and Advil as needed for dysmenorrhea/pelvic pain

## 2015-09-05 ENCOUNTER — Ambulatory Visit: Payer: BC Managed Care – PPO | Admitting: Obstetrics and Gynecology

## 2015-11-12 ENCOUNTER — Encounter
Admission: RE | Admit: 2015-11-12 | Discharge: 2015-11-12 | Disposition: A | Discharge status: Home / Self Care | Payer: BC Managed Care – PPO | Source: Ambulatory Visit | Attending: Obstetrics and Gynecology | Admitting: Obstetrics and Gynecology

## 2015-11-12 ENCOUNTER — Ambulatory Visit (INDEPENDENT_AMBULATORY_CARE_PROVIDER_SITE_OTHER): Payer: BC Managed Care – PPO | Admitting: Obstetrics and Gynecology

## 2015-11-12 ENCOUNTER — Encounter: Payer: Self-pay | Admitting: Obstetrics and Gynecology

## 2015-11-12 VITALS — BP 111/75 | HR 73 | Ht 61.0 in | Wt 124.1 lb

## 2015-11-12 DIAGNOSIS — N800 Endometriosis of uterus: Secondary | ICD-10-CM

## 2015-11-12 DIAGNOSIS — R87613 High grade squamous intraepithelial lesion on cytologic smear of cervix (HGSIL): Secondary | ICD-10-CM

## 2015-11-12 DIAGNOSIS — N938 Other specified abnormal uterine and vaginal bleeding: Secondary | ICD-10-CM

## 2015-11-12 DIAGNOSIS — R102 Pelvic and perineal pain: Secondary | ICD-10-CM

## 2015-11-12 DIAGNOSIS — N809 Endometriosis, unspecified: Secondary | ICD-10-CM

## 2015-11-12 DIAGNOSIS — N8003 Adenomyosis of the uterus: Secondary | ICD-10-CM

## 2015-11-12 DIAGNOSIS — Z01812 Encounter for preprocedural laboratory examination: Secondary | ICD-10-CM | POA: Insufficient documentation

## 2015-11-12 DIAGNOSIS — Z01818 Encounter for other preprocedural examination: Secondary | ICD-10-CM

## 2015-11-12 LAB — CBC WITH DIFFERENTIAL/PLATELET
BASOS ABS: 0 10*3/uL (ref 0–0.1)
BASOS PCT: 1 %
EOS ABS: 0.1 10*3/uL (ref 0–0.7)
Eosinophils Relative: 2 %
HEMATOCRIT: 39.3 % (ref 35.0–47.0)
Hemoglobin: 13.6 g/dL (ref 12.0–16.0)
Lymphocytes Relative: 30 %
Lymphs Abs: 1.2 10*3/uL (ref 1.0–3.6)
MCH: 31.4 pg (ref 26.0–34.0)
MCHC: 34.6 g/dL (ref 32.0–36.0)
MCV: 90.6 fL (ref 80.0–100.0)
MONO ABS: 0.5 10*3/uL (ref 0.2–0.9)
Monocytes Relative: 13 %
NEUTROS ABS: 2.3 10*3/uL (ref 1.4–6.5)
NEUTROS PCT: 54 %
PLATELETS: 159 10*3/uL (ref 150–440)
RBC: 4.34 MIL/uL (ref 3.80–5.20)
RDW: 12.9 % (ref 11.5–14.5)
WBC: 4.1 10*3/uL (ref 3.6–11.0)

## 2015-11-12 LAB — TYPE AND SCREEN
ABO/RH(D): O POS
Antibody Screen: NEGATIVE

## 2015-11-12 LAB — RAPID HIV SCREEN (HIV 1/2 AB+AG)
HIV 1/2 ANTIBODIES: NONREACTIVE
HIV-1 P24 ANTIGEN - HIV24: NONREACTIVE

## 2015-11-12 LAB — SURGICAL PCR SCREEN
MRSA, PCR: NEGATIVE
STAPHYLOCOCCUS AUREUS: NEGATIVE

## 2015-11-12 NOTE — Patient Instructions (Signed)
1.  Return in 1 week for postop check 

## 2015-11-12 NOTE — H&P (Addendum)
Subjective:  PREOPERATIVE HISTORY AND PHYSICAL     Date of surgery: 11/18/2015 Chief complaint: 1. Symptomatic endometriosis  Patient is a 25 y.o. G2P740female para 2002, status post BTL for contraception, with history of adenomyosis identified time of laparoscopy with peritoneal biopsies for evaluation of chronic pelvic pain/dysmenorrhea, [facility]. Indications for procedure are symptomatic adenomyosis and high-grade dysplasia status post LEEP cone biopsy.  Surgical findings from LEEP procedure and laparoscopy, hysteroscopy, D&C:  1. Normal staining of the vagina and cervix with Lugol's solution 2. Secretory-type endometrium in a cavity without obvious gross lesions 3. Pelvic adhesions between the omentum and the anterior abdominal wall as well as between the omentum in the right fallopian tube (excised). Evidence of previous tubal ligation. Grossly normal ovaries. White lesion on left uterosacral ligament and posterior lower uterine segment areas were excised and cauterized. Random biopsy of grossly normal-appearing cul-de-sac. Bladder flap was normal.    Pertinent Gynecological History: LEEP cone biopsy-CIN-3, completely excised; post LEEP ECC negative. Hysteroscopy/D&C-benign secretory endometrium without evidence of hyperplasia or carcinoma. Laparoscopy with peritoneal biopsies-adenomyosis Discussed Blood/Blood Products: yes   Menstrual History: OB History    Gravida Para Term Preterm AB TAB SAB Ectopic Multiple Living   2         2      Menarche age: NA Patient's last menstrual period was 10/29/2015 (exact date).    Past Medical History  Diagnosis Date  . Anxiety   . Depression   . HPV in female   . Headache     MIGRAINES  . Asthma     WELL-CONTROLLED-HAS INHALER BUT IT IS NOT HERS  . Hx MRSA infection 2009  . Adenomyosis of uterus determined by biopsy     Biopsy confirmed, uterine lower uterine segment    Past Surgical History   Procedure Laterality Date  . Tubal ligation  2013  . Leep N/A 05/27/2015    Procedure: LOOP ELECTROSURGICAL EXCISION PROCEDURE (LEEP); cone biopsy;  Surgeon: Herold HarmsMartin A Kylor Valverde, MD;  Location: [facility] ORS;  Service: Gynecology;  Laterality: N/A;  . Hysteroscopy w/d&c N/A 05/27/2015    Procedure: DILATATION AND CURETTAGE /HYSTEROSCOPY;  Surgeon: Herold HarmsMartin A Kerman Pfost, MD;  Location: [facility] ORS;  Service: Gynecology;  Laterality: N/A;  . Laparoscopy N/A 05/27/2015    Procedure: LAPAROSCOPY OPERATIVE, WITH ADHESIOLYSIS AND PERITONEAL BIOPSIES;  Surgeon: Herold HarmsMartin A Amiyah Shryock, MD;  Location: [facility] ORS;  Service: Gynecology;  Laterality: N/A;    OB History  Gravida Para Term Preterm AB SAB TAB Ectopic Multiple Living  2         2    # Outcome Date GA Lbr Len/2nd Weight Sex Delivery Anes PTL Lv  2 Gravida 2013    M Vag-Spont   Y  1 Gravida 2010    F Vag-Spont   Y      Social History   Social History  . Marital Status: Married    Spouse Name: N/A  . Number of Children: N/A  . Years of Education: N/A   Social History Main Topics  . Smoking status: Never Smoker   . Smokeless tobacco: Never Used  . Alcohol Use: Yes     Comment: SOCIAL  . Drug Use: No  . Sexual Activity: Yes    Birth Control/ Protection: Surgical   Other Topics Concern  . None   Social History Narrative    Family History  Problem Relation Age of Onset  . Breast cancer Maternal Grandmother   .  Cancer Maternal Grandmother      (Not in a hospital admission)  Allergies  Allergen Reactions  . Latex Rash    Review of Systems Constitutional: No recent fever/chills/sweats Respiratory: No recent cough/bronchitis Cardiovascular: No chest pain Gastrointestinal: No recent nausea/vomiting/diarrhea Genitourinary: No UTI symptoms Hematologic/lymphatic:No history of coagulopathy or recent blood thinner use    Objective:    BP 111/75 mmHg  Pulse 73  Ht 5' 1" (1.549 m)  Wt 124 lb 1.6 oz (56.291 kg)  BMI 23.46  kg/m2  LMP 10/29/2015 (Exact Date)  General:   Normal  Skin:   normal  HEENT:  Normal  Neck:  Supple without Adenopathy or Thyromegaly  Lungs:   Heart:              Breasts:   Abdomen:  Pelvis:  M/S   Extremeties:  Neuro:    clear to auscultation bilaterally   Normal without murmur   Not Examined   soft, non-tender; bowel sounds normal; no masses,  no organomegaly   Exam deferred to OR  No CVAT  Warm/Dry   Normal         Assessment:    1. Symptomatic adenomyosis with severe dysmenorrhea and chronic abnormal uterine bleeding 2. History of high-grade dysplasia, status post LEEP cone biopsy with complete excision of pathology 3. Status post BTL; no future children desired   Plan:   1. TVH RSO and left salpingectomy   Preoperative counseling: The patient is to undergo transvaginal hysterectomy right salpingo-oophorectomy and left salpingectomy on 11/18/2015. She is understanding of the planned procedures and is aware of and is accepting of all surgical risks which include but are not limited to bleeding, infection, pelvic organ injury with need for repair, blood clot disorders, anesthesia risks, etc. All questions have been answered. Informed consent is given. Patient is ready and willing to proceed with surgery as scheduled.  Dheeraj Hail A Jaequan Propes, MD  Note: This dictation was prepared with Dragon dictation along with smaller phrase technology. Any transcriptional errors that result from this process are unintentional.   

## 2015-11-12 NOTE — Patient Instructions (Signed)
Your procedure is scheduled on: Monday 11/18/15 Report to Day Surgery. 2ND FLOOR MEDICAL MALL ENTRANCE To find out your arrival time please call [phone] between 1PM - 3PM on Friday 11/15/15.  Remember: Instructions that are not followed completely may result in serious medical risk, up to and including death, or upon the discretion of your surgeon and anesthesiologist your surgery may need to be rescheduled.    __X__ 1. Do not eat food or drink liquids after midnight. No gum chewing or hard candies.     __X__ 2. No Alcohol for 24 hours before or after surgery.   ____ 3. Bring all medications with you on the day of surgery if instructed.    __X__ 4. Notify your doctor if there is any change in your medical condition     (cold, fever, infections).     Do not wear jewelry, make-up, hairpins, clips or nail polish.  Do not wear lotions, powders, or perfumes.   Do not shave 48 hours prior to surgery. Men may shave face and neck.  Do not bring valuables to the hospital.    [facility] belongings or valuables.               Contacts, dentures or bridgework may not be worn into surgery.  Leave your suitcase in the car. After surgery it may be brought to your room.  For patients admitted to the hospital, discharge time is determined by your                treatment team.   Patients discharged the day of surgery will not be allowed to drive home.   Please read over the following fact sheets that you were given:   MRSA Information and Surgical Site Infection Prevention   ____ Take these medicines the morning of surgery with A SIP OF WATER:    1. NONE  2.   3.   4.  5.  6.  ____ Fleet Enema (as directed)   __X__ Use CHG Soap as directed  ____ Use inhalers on the day of surgery  ____ Stop metformin 2 days prior to surgery    ____ Take 1/2 of usual insulin dose the night before surgery and none on the morning of surgery.   __X__ Stop  Coumadin/Plavix/aspirin on STOP EXCEDRIN AND IBUPROFEN UNTIL AFTER SURGERY MAY USE TYLENOL  __X__ Stop Anti-inflammatories on TODAY   ____ Stop supplements until after surgery.    ____ Bring C-Pap to the hospital.

## 2015-11-12 NOTE — Progress Notes (Signed)
Subjective:  PREOPERATIVE HISTORY AND PHYSICAL     Date of surgery: 11/18/2015 Chief complaint: 1. Symptomatic endometriosis  Patient is a 25 y.o. G2P740female para 2002, status post BTL for contraception, with history of adenomyosis identified time of laparoscopy with peritoneal biopsies for evaluation of chronic pelvic pain/dysmenorrhea, [facility]. Indications for procedure are symptomatic adenomyosis and high-grade dysplasia status post LEEP cone biopsy.  Surgical findings from LEEP procedure and laparoscopy, hysteroscopy, D&C:  1. Normal staining of the vagina and cervix with Lugol's solution 2. Secretory-type endometrium in a cavity without obvious gross lesions 3. Pelvic adhesions between the omentum and the anterior abdominal wall as well as between the omentum in the right fallopian tube (excised). Evidence of previous tubal ligation. Grossly normal ovaries. White lesion on left uterosacral ligament and posterior lower uterine segment areas were excised and cauterized. Random biopsy of grossly normal-appearing cul-de-sac. Bladder flap was normal.    Pertinent Gynecological History: LEEP cone biopsy-CIN-3, completely excised; post LEEP ECC negative. Hysteroscopy/D&C-benign secretory endometrium without evidence of hyperplasia or carcinoma. Laparoscopy with peritoneal biopsies-adenomyosis Discussed Blood/Blood Products: yes   Menstrual History: OB History    Gravida Para Term Preterm AB TAB SAB Ectopic Multiple Living   2         2      Menarche age: NA Patient's last menstrual period was 10/29/2015 (exact date).    Past Medical History  Diagnosis Date  . Anxiety   . Depression   . HPV in female   . Headache     MIGRAINES  . Asthma     WELL-CONTROLLED-HAS INHALER BUT IT IS NOT HERS  . Hx MRSA infection 2009  . Adenomyosis of uterus determined by biopsy     Biopsy confirmed, uterine lower uterine segment    Past Surgical History   Procedure Laterality Date  . Tubal ligation  2013  . Leep N/A 05/27/2015    Procedure: LOOP ELECTROSURGICAL EXCISION PROCEDURE (LEEP); cone biopsy;  Surgeon: Herold HarmsMartin A Defrancesco, MD;  Location: [facility] ORS;  Service: Gynecology;  Laterality: N/A;  . Hysteroscopy w/d&c N/A 05/27/2015    Procedure: DILATATION AND CURETTAGE /HYSTEROSCOPY;  Surgeon: Herold HarmsMartin A Defrancesco, MD;  Location: [facility] ORS;  Service: Gynecology;  Laterality: N/A;  . Laparoscopy N/A 05/27/2015    Procedure: LAPAROSCOPY OPERATIVE, WITH ADHESIOLYSIS AND PERITONEAL BIOPSIES;  Surgeon: Herold HarmsMartin A Defrancesco, MD;  Location: [facility] ORS;  Service: Gynecology;  Laterality: N/A;    OB History  Gravida Para Term Preterm AB SAB TAB Ectopic Multiple Living  2         2    # Outcome Date GA Lbr Len/2nd Weight Sex Delivery Anes PTL Lv  2 Gravida 2013    M Vag-Spont   Y  1 Gravida 2010    F Vag-Spont   Y      Social History   Social History  . Marital Status: Married    Spouse Name: N/A  . Number of Children: N/A  . Years of Education: N/A   Social History Main Topics  . Smoking status: Never Smoker   . Smokeless tobacco: Never Used  . Alcohol Use: Yes     Comment: SOCIAL  . Drug Use: No  . Sexual Activity: Yes    Birth Control/ Protection: Surgical   Other Topics Concern  . None   Social History Narrative    Family History  Problem Relation Age of Onset  . Breast cancer Maternal Grandmother   .  Cancer Maternal Grandmother      (Not in a hospital admission)  Allergies  Allergen Reactions  . Latex Rash    Review of Systems Constitutional: No recent fever/chills/sweats Respiratory: No recent cough/bronchitis Cardiovascular: No chest pain Gastrointestinal: No recent nausea/vomiting/diarrhea Genitourinary: No UTI symptoms Hematologic/lymphatic:No history of coagulopathy or recent blood thinner use    Objective:    BP 111/75 mmHg  Pulse 73  Ht  (1.549 m)  Wt 124 lb 1.6 oz (56.291 kg)  BMI 23.46  kg/m2  LMP 10/29/2015 (Exact Date)  General:   Normal  Skin:   normal  HEENT:  Normal  Neck:  Supple without Adenopathy or Thyromegaly  Lungs:   Heart:              Breasts:   Abdomen:  Pelvis:  M/S   Extremeties:  Neuro:    clear to auscultation bilaterally   Normal without murmur   Not Examined   soft, non-tender; bowel sounds normal; no masses,  no organomegaly   Exam deferred to OR  No CVAT  Warm/Dry   Normal         Assessment:    1. Symptomatic adenomyosis with severe dysmenorrhea and chronic abnormal uterine bleeding 2. History of high-grade dysplasia, status post LEEP cone biopsy with complete excision of pathology 3. Status post BTL; no future children desired   Plan:   1. TVH RSO and left salpingectomy   Preoperative counseling: The patient is to undergo transvaginal hysterectomy right salpingo-oophorectomy and left salpingectomy on 11/18/2015. She is understanding of the planned procedures and is aware of and is accepting of all surgical risks which include but are not limited to bleeding, infection, pelvic organ injury with need for repair, blood clot disorders, anesthesia risks, etc. All questions have been answered. Informed consent is given. Patient is ready and willing to proceed with surgery as scheduled.  Herold Harms, MD  Note: This dictation was prepared with Dragon dictation along with smaller phrase technology. Any transcriptional errors that result from this process are unintentional.

## 2015-11-13 LAB — RPR: RPR: NONREACTIVE

## 2015-11-13 LAB — ABO/RH: ABO/RH(D): O POS

## 2015-11-18 ENCOUNTER — Encounter
Admission: RE | Disposition: A | Discharge status: Home / Self Care | Payer: Self-pay | Source: Ambulatory Visit | Attending: Obstetrics and Gynecology

## 2015-11-18 ENCOUNTER — Ambulatory Visit: Payer: BC Managed Care – PPO | Admitting: Anesthesiology

## 2015-11-18 ENCOUNTER — Observation Stay
Admission: RE | Admit: 2015-11-18 | Discharge: 2015-11-19 | Disposition: A | Discharge status: Home / Self Care | Payer: BC Managed Care – PPO | Source: Ambulatory Visit | Attending: Obstetrics and Gynecology | Admitting: Obstetrics and Gynecology

## 2015-11-18 ENCOUNTER — Encounter: Payer: Self-pay | Admitting: *Deleted

## 2015-11-18 DIAGNOSIS — G43909 Migraine, unspecified, not intractable, without status migrainosus: Secondary | ICD-10-CM | POA: Diagnosis not present

## 2015-11-18 DIAGNOSIS — N946 Dysmenorrhea, unspecified: Secondary | ICD-10-CM | POA: Diagnosis not present

## 2015-11-18 DIAGNOSIS — G8929 Other chronic pain: Secondary | ICD-10-CM | POA: Insufficient documentation

## 2015-11-18 DIAGNOSIS — R102 Pelvic and perineal pain: Secondary | ICD-10-CM

## 2015-11-18 DIAGNOSIS — N939 Abnormal uterine and vaginal bleeding, unspecified: Secondary | ICD-10-CM | POA: Diagnosis not present

## 2015-11-18 DIAGNOSIS — R87613 High grade squamous intraepithelial lesion on cytologic smear of cervix (HGSIL): Secondary | ICD-10-CM

## 2015-11-18 DIAGNOSIS — Z8614 Personal history of Methicillin resistant Staphylococcus aureus infection: Secondary | ICD-10-CM | POA: Diagnosis not present

## 2015-11-18 DIAGNOSIS — Z90721 Acquired absence of ovaries, unilateral: Secondary | ICD-10-CM

## 2015-11-18 DIAGNOSIS — N800 Endometriosis of uterus: Principal | ICD-10-CM | POA: Insufficient documentation

## 2015-11-18 DIAGNOSIS — N938 Other specified abnormal uterine and vaginal bleeding: Secondary | ICD-10-CM | POA: Diagnosis not present

## 2015-11-18 DIAGNOSIS — F329 Major depressive disorder, single episode, unspecified: Secondary | ICD-10-CM | POA: Insufficient documentation

## 2015-11-18 DIAGNOSIS — J45909 Unspecified asthma, uncomplicated: Secondary | ICD-10-CM | POA: Insufficient documentation

## 2015-11-18 DIAGNOSIS — F419 Anxiety disorder, unspecified: Secondary | ICD-10-CM | POA: Diagnosis not present

## 2015-11-18 DIAGNOSIS — N736 Female pelvic peritoneal adhesions (postinfective): Secondary | ICD-10-CM | POA: Diagnosis not present

## 2015-11-18 DIAGNOSIS — N838 Other noninflammatory disorders of ovary, fallopian tube and broad ligament: Secondary | ICD-10-CM | POA: Diagnosis not present

## 2015-11-18 DIAGNOSIS — Z90710 Acquired absence of both cervix and uterus: Secondary | ICD-10-CM

## 2015-11-18 HISTORY — PX: OOPHORECTOMY: SHX6387

## 2015-11-18 HISTORY — PX: VAGINAL HYSTERECTOMY: SHX2639

## 2015-11-18 HISTORY — PX: BILATERAL SALPINGECTOMY: SHX5743

## 2015-11-18 LAB — POCT PREGNANCY, URINE: Preg Test, Ur: NEGATIVE

## 2015-11-18 SURGERY — HYSTERECTOMY, VAGINAL
Anesthesia: General

## 2015-11-18 MED ORDER — FAMOTIDINE 20 MG PO TABS
20.0000 mg | ORAL_TABLET | Freq: Once | ORAL | Status: AC
  Administered 2015-11-18: 20 mg via ORAL

## 2015-11-18 MED ORDER — BISACODYL 10 MG RE SUPP: 10.0000 mg | Freq: Every day | RECTAL | Status: DC | PRN

## 2015-11-18 MED ORDER — KETOROLAC TROMETHAMINE 30 MG/ML IJ SOLN
INTRAMUSCULAR | Status: DC | PRN
  Administered 2015-11-18: 30 mg via INTRAVENOUS

## 2015-11-18 MED ORDER — SIMETHICONE 80 MG PO CHEW: 80.0000 mg | CHEWABLE_TABLET | Freq: Four times a day (QID) | ORAL | Status: DC | PRN

## 2015-11-18 MED ORDER — SUGAMMADEX SODIUM 200 MG/2ML IV SOLN
INTRAVENOUS | Status: DC | PRN
  Administered 2015-11-18: 112.4 mg via INTRAVENOUS

## 2015-11-18 MED ORDER — KETOROLAC TROMETHAMINE 30 MG/ML IJ SOLN: 30.0000 mg | Freq: Four times a day (QID) | INTRAMUSCULAR | Status: DC

## 2015-11-18 MED ORDER — OXYCODONE-ACETAMINOPHEN 5-325 MG PO TABS
1.0000 | ORAL_TABLET | ORAL | Status: DC | PRN
  Administered 2015-11-18 – 2015-11-19 (×5): 1 via ORAL
  Filled 2015-11-18 (×6): qty 1

## 2015-11-18 MED ORDER — MIDAZOLAM HCL 2 MG/2ML IJ SOLN
INTRAMUSCULAR | Status: DC | PRN
Start: 2015-11-18 — End: 2015-11-18
  Administered 2015-11-18: 2 mg via INTRAVENOUS

## 2015-11-18 MED ORDER — DEXAMETHASONE SODIUM PHOSPHATE 10 MG/ML IJ SOLN
INTRAMUSCULAR | Status: DC | PRN
  Administered 2015-11-18: 10 mg via INTRAVENOUS

## 2015-11-18 MED ORDER — PROPOFOL 10 MG/ML IV BOLUS
INTRAVENOUS | Status: DC | PRN
  Administered 2015-11-18 (×2): 100 mg via INTRAVENOUS

## 2015-11-18 MED ORDER — MORPHINE SULFATE (PF) 2 MG/ML IV SOLN
1.0000 mg | INTRAVENOUS | Status: DC | PRN
  Administered 2015-11-18: 2 mg via INTRAVENOUS
  Administered 2015-11-18: 1 mg via INTRAVENOUS
  Administered 2015-11-18 (×2): 2 mg via INTRAVENOUS
  Administered 2015-11-19: 1 mg via INTRAVENOUS
  Filled 2015-11-18 (×5): qty 1

## 2015-11-18 MED ORDER — ONDANSETRON HCL 4 MG/2ML IJ SOLN: 4.0000 mg | Freq: Once | INTRAMUSCULAR | Status: DC | PRN

## 2015-11-18 MED ORDER — ROCURONIUM BROMIDE 100 MG/10ML IV SOLN
INTRAVENOUS | Status: DC | PRN
  Administered 2015-11-18: 40 mg via INTRAVENOUS

## 2015-11-18 MED ORDER — ONDANSETRON HCL 4 MG/2ML IJ SOLN
INTRAMUSCULAR | Status: DC | PRN
  Administered 2015-11-18: 4 mg via INTRAVENOUS

## 2015-11-18 MED ORDER — FAMOTIDINE 20 MG PO TABS
ORAL_TABLET | ORAL | Status: AC
Start: 2015-11-18 — End: 2015-11-18
  Filled 2015-11-18: qty 1

## 2015-11-18 MED ORDER — BELLADONNA ALKALOIDS-OPIUM 16.2-60 MG RE SUPP
RECTAL | Status: AC
  Filled 2015-11-18: qty 1

## 2015-11-18 MED ORDER — FENTANYL CITRATE (PF) 100 MCG/2ML IJ SOLN
INTRAMUSCULAR | Status: AC
  Administered 2015-11-18: 25 ug via INTRAVENOUS
  Filled 2015-11-18: qty 2

## 2015-11-18 MED ORDER — EPHEDRINE SULFATE 50 MG/ML IJ SOLN
INTRAMUSCULAR | Status: DC | PRN
  Administered 2015-11-18: 5 mg via INTRAVENOUS

## 2015-11-18 MED ORDER — KETOROLAC TROMETHAMINE 30 MG/ML IJ SOLN
30.0000 mg | Freq: Four times a day (QID) | INTRAMUSCULAR | Status: DC
  Administered 2015-11-18 – 2015-11-19 (×4): 30 mg via INTRAVENOUS
  Filled 2015-11-18 (×4): qty 1

## 2015-11-18 MED ORDER — LIDOCAINE HCL (CARDIAC) 20 MG/ML IV SOLN
INTRAVENOUS | Status: DC | PRN
  Administered 2015-11-18: 60 mg via INTRAVENOUS

## 2015-11-18 MED ORDER — CEFAZOLIN SODIUM-DEXTROSE 2-4 GM/100ML-% IV SOLN
2.0000 g | INTRAVENOUS | Status: AC
  Administered 2015-11-18: 2 g via INTRAVENOUS

## 2015-11-18 MED ORDER — LACTATED RINGERS IV SOLN: INTRAVENOUS | Status: DC

## 2015-11-18 MED ORDER — LACTATED RINGERS IV SOLN
INTRAVENOUS | Status: DC
  Administered 2015-11-18 – 2015-11-19 (×2): via INTRAVENOUS

## 2015-11-18 MED ORDER — CEFAZOLIN SODIUM-DEXTROSE 2-4 GM/100ML-% IV SOLN
INTRAVENOUS | Status: AC
  Filled 2015-11-18: qty 100

## 2015-11-18 MED ORDER — ACETAMINOPHEN 325 MG PO TABS: 650.0000 mg | ORAL_TABLET | ORAL | Status: DC | PRN

## 2015-11-18 MED ORDER — FENTANYL CITRATE (PF) 100 MCG/2ML IJ SOLN
INTRAMUSCULAR | Status: DC | PRN
  Administered 2015-11-18 (×4): 50 ug via INTRAVENOUS

## 2015-11-18 MED ORDER — LACTATED RINGERS IV SOLN
INTRAVENOUS | Status: DC
  Administered 2015-11-18 (×3): via INTRAVENOUS

## 2015-11-18 MED ORDER — FENTANYL CITRATE (PF) 100 MCG/2ML IJ SOLN
25.0000 ug | INTRAMUSCULAR | Status: AC | PRN
  Administered 2015-11-18 (×6): 25 ug via INTRAVENOUS

## 2015-11-18 MED ORDER — ESTROGENS, CONJUGATED 0.625 MG/GM VA CREA
TOPICAL_CREAM | VAGINAL | Status: AC
  Filled 2015-11-18: qty 30

## 2015-11-18 MED ORDER — DOCUSATE SODIUM 100 MG PO CAPS
100.0000 mg | ORAL_CAPSULE | Freq: Two times a day (BID) | ORAL | Status: DC
  Administered 2015-11-18 – 2015-11-19 (×2): 100 mg via ORAL
  Filled 2015-11-18 (×2): qty 1

## 2015-11-18 SURGICAL SUPPLY — 33 items
BAG URO DRAIN 2000ML W/SPOUT (MISCELLANEOUS) ×5 IMPLANT
CANISTER SUCT 1200ML W/VALVE (MISCELLANEOUS) ×5 IMPLANT
CATH FOLEY 2WAY  5CC 16FR (CATHETERS) ×2
CATH URTH 16FR FL 2W BLN LF (CATHETERS) ×3 IMPLANT
DRAPE PERI LITHO V/GYN (MISCELLANEOUS) ×5 IMPLANT
DRAPE SHEET LG 3/4 BI-LAMINATE (DRAPES) ×5 IMPLANT
DRAPE UNDER BUTTOCK W/FLU (DRAPES) ×5 IMPLANT
ELECT REM PT RETURN 9FT ADLT (ELECTROSURGICAL) ×5
ELECTRODE REM PT RTRN 9FT ADLT (ELECTROSURGICAL) ×3 IMPLANT
GAUZE PACK 2X3YD (MISCELLANEOUS) ×5 IMPLANT
GAUZE PACKING 1/2X5YD (GAUZE/BANDAGES/DRESSINGS) ×5 IMPLANT
GLOVE BIO SURGEON STRL SZ8 (GLOVE) ×10 IMPLANT
GLOVE INDICATOR 8.0 STRL GRN (GLOVE) ×5 IMPLANT
GOWN STRL REUS W/ TWL LRG LVL3 (GOWN DISPOSABLE) ×6 IMPLANT
GOWN STRL REUS W/ TWL XL LVL3 (GOWN DISPOSABLE) ×3 IMPLANT
GOWN STRL REUS W/TWL LRG LVL3 (GOWN DISPOSABLE) ×4
GOWN STRL REUS W/TWL XL LVL3 (GOWN DISPOSABLE) ×2
KIT RM TURNOVER CYSTO AR (KITS) ×5 IMPLANT
LABEL OR SOLS (LABEL) ×5 IMPLANT
NS IRRIG 500ML POUR BTL (IV SOLUTION) ×5 IMPLANT
PACK BASIN MINOR ARMC (MISCELLANEOUS) ×5 IMPLANT
PAD OB MATERNITY 4.3X12.25 (PERSONAL CARE ITEMS) ×5 IMPLANT
PAD PREP 24X41 OB/GYN DISP (PERSONAL CARE ITEMS) ×5 IMPLANT
SOL PREP PVP 2OZ (MISCELLANEOUS) ×5
SOLUTION PREP PVP 2OZ (MISCELLANEOUS) ×3 IMPLANT
SUT CHROMIC 0 CT 1 (SUTURE) ×10 IMPLANT
SUT CHROMIC 1-0 (SUTURE) ×5 IMPLANT
SUT CHROMIC 2 0 CT 1 (SUTURE) ×35 IMPLANT
SUT CHROMIC 2 0 SH (SUTURE) ×10 IMPLANT
SUT VIC AB 0 CT1 27 (SUTURE) ×6
SUT VIC AB 0 CT1 27XCR 8 STRN (SUTURE) ×9 IMPLANT
SUT VIC AB 0 CT1 36 (SUTURE) ×15 IMPLANT
SYRINGE 10CC LL (SYRINGE) ×5 IMPLANT

## 2015-11-18 NOTE — Interval H&P Note (Signed)
History and Physical Interval Note:  11/18/2015 9:04 AM  Carrie LoserSarah M Luedke  has presented today for surgery, with the diagnosis of PELVIC PAIN, ADENOMYOSIS,CERVICAL DYSPLASIA, DYSMENORRHEA  The various methods of treatment have been discussed with the patient and family. After consideration of risks, benefits and other options for treatment, the patient has consented to  Procedure(s): HYSTERECTOMY VAGINAL (N/A) BILATERAL SALPINGECTOMY (Bilateral) RIGHT OOPHORECTOMY as a surgical intervention .  The patient's history has been reviewed, patient examined, no change in status, stable for surgery.  I have reviewed the patient's chart and labs.  Questions were answered to the patient's satisfaction.     Daphine DeutscherMartin A Jaimeson Gopal

## 2015-11-18 NOTE — Transfer of Care (Signed)
Immediate Anesthesia Transfer of Care Note  Patient: Carrie LoserSarah M Souders  Procedure(s) Performed: Procedure(s): HYSTERECTOMY VAGINAL (N/A) BILATERAL SALPINGECTOMY (Bilateral) RIGHT OOPHORECTOMY  Patient Location: PACU  Anesthesia Type:General  Level of Consciousness: alert  and sedated  Airway & Oxygen Therapy: Patient Spontanous Breathing and Patient connected to nasal cannula oxygen  Post-op Assessment: Report given to RN and Post -op Vital signs reviewed and stable  Post vital signs: Reviewed and stable  Last Vitals:  Filed Vitals:   11/18/15 0805  BP: 101/63  Pulse: 73  Temp: 36.8 C  Resp: 16    Last Pain: There were no vitals filed for this visit.       Complications: No apparent anesthesia complications

## 2015-11-18 NOTE — Anesthesia Preprocedure Evaluation (Addendum)
Anesthesia Evaluation  Patient identified by MRN, date of birth, ID band Patient awake    Reviewed: Allergy & Precautions, NPO status , Patient's Chart, lab work & pertinent test results  Airway Mallampati: I  TM Distance: >3 FB Neck ROM: Full    Dental no notable dental hx. (+) Chipped   Pulmonary asthma ,    Pulmonary exam normal breath sounds clear to auscultation       Cardiovascular negative cardio ROS Normal cardiovascular exam     Neuro/Psych  Headaches, PSYCHIATRIC DISORDERS Anxiety Depression    GI/Hepatic negative GI ROS, Neg liver ROS,   Endo/Other  negative endocrine ROS  Renal/GU negative Renal ROS  Female GU complaint Chronic pelvic pain    Musculoskeletal negative musculoskeletal ROS (+)   Abdominal Normal abdominal exam  (+)   Peds negative pediatric ROS (+)  Hematology negative hematology ROS (+)   Anesthesia Other Findings Chronic pelvic pain Hx of MRSA infection  Reproductive/Obstetrics negative OB ROS                             Anesthesia Physical Anesthesia Plan  ASA: II  Anesthesia Plan: General   Post-op Pain Management:    Induction: Intravenous  Airway Management Planned: Oral ETT  Additional Equipment:   Intra-op Plan:   Post-operative Plan: Extubation in OR  Informed Consent: I have reviewed the patients History and Physical, chart, labs and discussed the procedure including the risks, benefits and alternatives for the proposed anesthesia with the patient or authorized representative who has indicated his/her understanding and acceptance.   Dental advisory given  Plan Discussed with: CRNA and Surgeon  Anesthesia Plan Comments:         Anesthesia Quick Evaluation

## 2015-11-18 NOTE — Anesthesia Procedure Notes (Signed)
Procedure Name: Intubation Date/Time: 11/18/2015 9:44 AM Performed by: Evelena PeatFERRERO-CONOVER, Anael Rosch Pre-anesthesia Checklist: Patient identified, Emergency Drugs available, Suction available and Patient being monitored Patient Re-evaluated:Patient Re-evaluated prior to inductionOxygen Delivery Method: Circle system utilized Preoxygenation: Pre-oxygenation with 100% oxygen Intubation Type: IV induction Ventilation: Mask ventilation without difficulty Laryngoscope Size: Miller and 2 Grade View: Grade I Tube type: Oral Tube size: 7.0 mm Number of attempts: 1 Airway Equipment and Method: Stylet Placement Confirmation: ETT inserted through vocal cords under direct vision,  positive ETCO2 and breath sounds checked- equal and bilateral Secured at: 21 cm Tube secured with: Tape Dental Injury: Teeth and Oropharynx as per pre-operative assessment

## 2015-11-18 NOTE — Op Note (Signed)
OPERATIVE NOTE:  Carrie MalesSarah M Rios PROCEDURE DATE: 11/18/2015   PREOPERATIVE DIAGNOSIS: Symptomatic endometriosis POSTOPERATIVE DIAGNOSIS: Symptomatic endometriosis PROCEDURE: TVH bilateral salpingectomy and right oophorectomy  SURGEON:  Herold HarmsMartin A Marcena Dias, MD ASSISTANTS: Dr. Valentino Saxonherry ANESTHESIA: General INDICATIONS: 25 y.o. G2P0 with symptomatic endometriosis/adenomyosis including severe dysmenorrhea, chronic pelvic pain midline and right lower quadrant, who presents for definitive surgical management. She is finished with childbearing.  FINDINGS:   Normal appearing uterus, tubes, and ovaries.   I/O's: Total I/O In: 1000 [I.V.:1000] Out: 200 [Blood:200] COUNTS:  YES SPECIMENS: Uterus with cervix; bilateral fallopian tubes; right ovary ANTIBIOTIC PROPHYLAXIS:Ancef 2 grams COMPLICATIONS: None immediate  PROCEDURE IN DETAIL: Patient was brought to the operating room and was placed in the supine position. General endotracheal anesthesia was induced without difficulty. She was placed in the dorsal lithotomy position using candycane stirrups. A Betadine perineal intravaginal prep and drape was performed in standard fashion. A Foley catheter was placed and was draining clear yellow urine from the bladder. Timeout was completed. Weighted speculum was placed in the vagina. Double-tooth tenaculum was placed on the cervix. Posterior colpotomy was made with Mayo scissors. Uterosacral ligaments were clamped cut and stick tied using 0 Vicryl suture. These were tagged. Cervix was circumscribed using the Bovie cautery and the bladder was dissected off lower uterine segment through sharp and blunt dissection. Eventually the anterior cul-de-sac was entered. The cardinal broad ligament complexes were sequentially clamped cut and stick tied using 0 Vicryl suture. This was carried out up to the level of the utero-ovarian ligaments which were then crossclamped and cut. Specimen was removed from the operative field.  The right inferior pelvic ligament was crossclamped with Heaney clamp the right tube and ovary was excised. Double suture ligature was placed on this pedicle the first ligature being a free tie and the second being a stick tie. The left fallopian tube segment was crossclamped and excised from the operative field. Suture ligature of 0 Vicryl was used to facilitate hemostasis. One additional running locking stitch was used along the right pedicles in order to optimize hemostasis. This was followed by closure of the peritoneum with a 0 Vicryl suture being used a pursestring manner. The vagina was then reapproximated using 2-0 chromic suture in simple interrupted sutures. Upon completion of the procedure all instruments was removed from the vagina. Patient was then awakened mobilized and taken to recovery room in satisfactory condition  Carrie Hemrick A. Beatris Sie Francesco, MD, [facility]

## 2015-11-18 NOTE — H&P (View-Only) (Signed)
Subjective:  PREOPERATIVE HISTORY AND PHYSICAL     Date of surgery: 11/18/2015 Chief complaint: 1. Symptomatic endometriosis  Patient is a 25 y.o. G2P740female para 2002, status post BTL for contraception, with history of adenomyosis identified time of laparoscopy with peritoneal biopsies for evaluation of chronic pelvic pain/dysmenorrhea, [facility]. Indications for procedure are symptomatic adenomyosis and high-grade dysplasia status post LEEP cone biopsy.  Surgical findings from LEEP procedure and laparoscopy, hysteroscopy, D&C:  1. Normal staining of the vagina and cervix with Lugol's solution 2. Secretory-type endometrium in a cavity without obvious gross lesions 3. Pelvic adhesions between the omentum and the anterior abdominal wall as well as between the omentum in the right fallopian tube (excised). Evidence of previous tubal ligation. Grossly normal ovaries. White lesion on left uterosacral ligament and posterior lower uterine segment areas were excised and cauterized. Random biopsy of grossly normal-appearing cul-de-sac. Bladder flap was normal.    Pertinent Gynecological History: LEEP cone biopsy-CIN-3, completely excised; post LEEP ECC negative. Hysteroscopy/D&C-benign secretory endometrium without evidence of hyperplasia or carcinoma. Laparoscopy with peritoneal biopsies-adenomyosis Discussed Blood/Blood Products: yes   Menstrual History: OB History    Gravida Para Term Preterm AB TAB SAB Ectopic Multiple Living   2         2      Menarche age: NA Patient's last menstrual period was 10/29/2015 (exact date).    Past Medical History  Diagnosis Date  . Anxiety   . Depression   . HPV in female   . Headache     MIGRAINES  . Asthma     WELL-CONTROLLED-HAS INHALER BUT IT IS NOT HERS  . Hx MRSA infection 2009  . Adenomyosis of uterus determined by biopsy     Biopsy confirmed, uterine lower uterine segment    Past Surgical History   Procedure Laterality Date  . Tubal ligation  2013  . Leep N/A 05/27/2015    Procedure: LOOP ELECTROSURGICAL EXCISION PROCEDURE (LEEP); cone biopsy;  Surgeon: Herold HarmsMartin A Juliene Kirsh, MD;  Location: [facility] ORS;  Service: Gynecology;  Laterality: N/A;  . Hysteroscopy w/d&c N/A 05/27/2015    Procedure: DILATATION AND CURETTAGE /HYSTEROSCOPY;  Surgeon: Herold HarmsMartin A Yakov Bergen, MD;  Location: [facility] ORS;  Service: Gynecology;  Laterality: N/A;  . Laparoscopy N/A 05/27/2015    Procedure: LAPAROSCOPY OPERATIVE, WITH ADHESIOLYSIS AND PERITONEAL BIOPSIES;  Surgeon: Herold HarmsMartin A Wister Hoefle, MD;  Location: [facility] ORS;  Service: Gynecology;  Laterality: N/A;    OB History  Gravida Para Term Preterm AB SAB TAB Ectopic Multiple Living  2         2    # Outcome Date GA Lbr Len/2nd Weight Sex Delivery Anes PTL Lv  2 Gravida 2013    M Vag-Spont   Y  1 Gravida 2010    F Vag-Spont   Y      Social History   Social History  . Marital Status: Married    Spouse Name: N/A  . Number of Children: N/A  . Years of Education: N/A   Social History Main Topics  . Smoking status: Never Smoker   . Smokeless tobacco: Never Used  . Alcohol Use: Yes     Comment: SOCIAL  . Drug Use: No  . Sexual Activity: Yes    Birth Control/ Protection: Surgical   Other Topics Concern  . None   Social History Narrative    Family History  Problem Relation Age of Onset  . Breast cancer Maternal Grandmother   .  Cancer Maternal Grandmother      (Not in a hospital admission)  Allergies  Allergen Reactions  . Latex Rash    Review of Systems Constitutional: No recent fever/chills/sweats Respiratory: No recent cough/bronchitis Cardiovascular: No chest pain Gastrointestinal: No recent nausea/vomiting/diarrhea Genitourinary: No UTI symptoms Hematologic/lymphatic:No history of coagulopathy or recent blood thinner use    Objective:    BP 111/75 mmHg  Pulse 73  Ht  (1.549 m)  Wt 124 lb 1.6 oz (56.291 kg)  BMI 23.46  kg/m2  LMP 10/29/2015 (Exact Date)  General:   Normal  Skin:   normal  HEENT:  Normal  Neck:  Supple without Adenopathy or Thyromegaly  Lungs:   Heart:              Breasts:   Abdomen:  Pelvis:  M/S   Extremeties:  Neuro:    clear to auscultation bilaterally   Normal without murmur   Not Examined   soft, non-tender; bowel sounds normal; no masses,  no organomegaly   Exam deferred to OR  No CVAT  Warm/Dry   Normal         Assessment:    1. Symptomatic adenomyosis with severe dysmenorrhea and chronic abnormal uterine bleeding 2. History of high-grade dysplasia, status post LEEP cone biopsy with complete excision of pathology 3. Status post BTL; no future children desired   Plan:   1. TVH RSO and left salpingectomy   Preoperative counseling: The patient is to undergo transvaginal hysterectomy right salpingo-oophorectomy and left salpingectomy on 11/18/2015. She is understanding of the planned procedures and is aware of and is accepting of all surgical risks which include but are not limited to bleeding, infection, pelvic organ injury with need for repair, blood clot disorders, anesthesia risks, etc. All questions have been answered. Informed consent is given. Patient is ready and willing to proceed with surgery as scheduled.  Herold Harms, MD  Note: This dictation was prepared with Dragon dictation along with smaller phrase technology. Any transcriptional errors that result from this process are unintentional.

## 2015-11-19 DIAGNOSIS — N800 Endometriosis of uterus: Secondary | ICD-10-CM | POA: Diagnosis not present

## 2015-11-19 LAB — HEMOGLOBIN: Hemoglobin: 11.9 g/dL — ABNORMAL LOW (ref 12.0–16.0)

## 2015-11-19 MED ORDER — OXYCODONE-ACETAMINOPHEN 5-325 MG PO TABS: 1.0000 | ORAL_TABLET | ORAL | Status: DC | PRN

## 2015-11-19 MED ORDER — DOCUSATE SODIUM 100 MG PO CAPS: 100.0000 mg | ORAL_CAPSULE | Freq: Two times a day (BID) | ORAL | Status: DC

## 2015-11-19 NOTE — Discharge Summary (Signed)
Physician Discharge Summary  Patient ID: Carrie LoserSarah M Rios MRN: [id number] DOB/AGE: 1990-11-19 24 y.o.  Admit date: 11/18/2015 Discharge date: 11/19/2015  Admission Diagnoses: Chronic Pelvic Pain  Discharge Diagnoses:  Chronic Pelvic Pain  Operative Procedures: Procedure(s): HYSTERECTOMY VAGINAL (N/A) BILATERAL SALPINGECTOMY (Bilateral) RIGHT OOPHORECTOMY  Hospital Course: Uncomplicated    Significant Diagnostic Studies:  Lab Results  Component Value Date   HGB 11.9* 11/19/2015   HGB 13.6 11/12/2015   HGB 13.2 05/28/2015   Lab Results  Component Value Date   HCT 39.3 11/12/2015   HCT 38.7 05/28/2015   HCT 41.4 05/27/2015   CBC Latest Ref Rng 11/19/2015 11/12/2015 05/28/2015  WBC 3.6 - 11.0 K/uL - 4.1 12.7(H)  Hemoglobin 12.0 - 16.0 g/dL 11.9(L) 13.6 13.2  Hematocrit 35.0 - 47.0 % - 39.3 38.7  Platelets 150 - 440 K/uL - 159 180     Discharged Condition: good  Discharge Exam: Blood pressure 94/62, pulse 67, temperature 98 F (36.7 C), temperature source Oral, resp. rate 16, height 5\' 1"  (1.549 m), weight 124 lb (56.246 kg), last menstrual period 10/25/2015, SpO2 100 %. Incision/Wound: no drainage  Disposition: 01-Home or Self Care      Discharge Instructions    Discharge patient    Complete by:  As directed             Medication List    TAKE these medications        aspirin-acetaminophen-caffeine 250-250-65 MG tablet  Commonly known as:  EXCEDRIN MIGRAINE  Take 1 tablet by mouth every 6 (six) hours as needed for headache or migraine.     docusate sodium 100 MG capsule  Commonly known as:  COLACE  Take 1 capsule (100 mg total) by mouth 2 (two) times daily.     ibuprofen 800 MG tablet  Commonly known as:  ADVIL,MOTRIN  Take 1 tablet (800 mg total) by mouth 3 (three) times daily.     multivitamin tablet  Take 1 tablet by mouth daily.     oxyCODONE-acetaminophen 5-325 MG tablet  Commonly known as:  PERCOCET/ROXICET  Take 1-2 tablets by mouth every 4  (four) hours as needed (moderate to severe pain (when tolerating fluids)).       Follow-up Information    Follow up with Herold HarmsMartin A Deyanira Fesler, MD. Ny[facility] CapesGo on 12/03/2015.   Specialties:  Obstetrics and Gynecology, Radiology   Why:  at 9:30 AM for Post Op Check   Contact information:   [address] [place] [id number] [phone]       Signed: Prentice DockerMartin A Vernella Niznik 11/19/2015, 8:33 PM

## 2015-11-19 NOTE — Progress Notes (Signed)
Patient discharged home with significant other. Discharge instructions, prescriptions and follow up appointment given to and reviewed with patient and significant other. Patient verbalized understanding. Escorted out via wheelchair by axiliary.  

## 2015-11-20 LAB — SURGICAL PATHOLOGY

## 2015-11-20 NOTE — Anesthesia Postprocedure Evaluation (Signed)
Anesthesia Post Note  Patient: Carrie LoserSarah M Stock  Procedure(s) Performed: Procedure(s) (LRB): HYSTERECTOMY VAGINAL (N/A) BILATERAL SALPINGECTOMY (Bilateral) RIGHT OOPHORECTOMY  Patient location during evaluation: PACU Anesthesia Type: General Level of consciousness: awake and alert and oriented Pain management: pain level controlled Vital Signs Assessment: post-procedure vital signs reviewed and stable Respiratory status: spontaneous breathing Cardiovascular status: blood pressure returned to baseline Anesthetic complications: no    Last Vitals:  Filed Vitals:   11/19/15 0818 11/19/15 1321  BP: 94/62   Pulse: 67   Temp: 36.6 C 36.7 C  Resp: 16     Last Pain:  Filed Vitals:   11/19/15 1442  PainSc: 3                  Lawren Sexson

## 2015-12-03 ENCOUNTER — Telehealth: Payer: Self-pay | Admitting: Obstetrics and Gynecology

## 2015-12-03 ENCOUNTER — Encounter: Payer: Self-pay | Admitting: Obstetrics and Gynecology

## 2015-12-03 ENCOUNTER — Ambulatory Visit (INDEPENDENT_AMBULATORY_CARE_PROVIDER_SITE_OTHER): Payer: BC Managed Care – PPO | Admitting: Obstetrics and Gynecology

## 2015-12-03 VITALS — BP 92/63 | HR 101 | Ht 61.0 in | Wt 119.8 lb

## 2015-12-03 DIAGNOSIS — Z0289 Encounter for other administrative examinations: Secondary | ICD-10-CM

## 2015-12-03 DIAGNOSIS — Z90721 Acquired absence of ovaries, unilateral: Secondary | ICD-10-CM

## 2015-12-03 DIAGNOSIS — R102 Pelvic and perineal pain: Secondary | ICD-10-CM

## 2015-12-03 DIAGNOSIS — N800 Endometriosis of uterus: Secondary | ICD-10-CM

## 2015-12-03 DIAGNOSIS — N8003 Adenomyosis of the uterus: Secondary | ICD-10-CM

## 2015-12-03 DIAGNOSIS — N809 Endometriosis, unspecified: Secondary | ICD-10-CM

## 2015-12-03 DIAGNOSIS — Z09 Encounter for follow-up examination after completed treatment for conditions other than malignant neoplasm: Secondary | ICD-10-CM

## 2015-12-03 DIAGNOSIS — Z90710 Acquired absence of both cervix and uterus: Secondary | ICD-10-CM

## 2015-12-03 NOTE — Progress Notes (Signed)
Chief complaint: 1.  Postop check. 2.  Status post TVH, bilateral salpingectomy.  2 weeks ago 3.  History of endometriosis/adenomyosis, symptomatic  Patient presents for two-week postoperative check after vaginal hysterectomy, bilateral salpingectomy and right oophorectomy for symptomatic endometriosis involving chronic pelvic pain, midline and right lower quadrant.  Patient is doing well with no significant pelvic pain at this time.  Bowel and bladder function are normal.  She is not requiring any analgesics.  Pathology from surgery did not demonstrate any residual endometriosis.  OBJECTIVE: BP 92/63 mmHg  Pulse 101  Ht 5\' 1"  (1.549 m)  Wt 119 lb 12.8 oz (54.341 kg)  BMI 22.65 kg/m2  LMP 10/29/2015 (Exact Date) Physical exam-deferred.  ASSESSMENT: 1.  Normal 2 week postoperative check status post TVH, bilateral salpingectomy and right oophorectomy.  PLAN: 1.  Routine postop care. 2.  Return in 4 weeks for final postoperative check  Herold HarmsMartin A Mikya Don, MD  Note: This dictation was prepared with Dragon dictation along with smaller phrase technology. Any transcriptional errors that result from this process are unintentional.

## 2015-12-03 NOTE — Telephone Encounter (Signed)
Patient would like FMLA mailed to her once it has been completed.Thanks

## 2015-12-03 NOTE — Patient Instructions (Signed)
1.  Return in 4 weeks for postop check

## 2015-12-04 NOTE — Telephone Encounter (Signed)
Pt aware fmla completed and mailed. Copy left up front.

## 2015-12-31 ENCOUNTER — Encounter: Payer: Self-pay | Admitting: Obstetrics and Gynecology

## 2015-12-31 ENCOUNTER — Ambulatory Visit (INDEPENDENT_AMBULATORY_CARE_PROVIDER_SITE_OTHER): Payer: BC Managed Care – PPO | Admitting: Obstetrics and Gynecology

## 2015-12-31 VITALS — BP 96/65 | HR 84 | Ht 61.0 in | Wt 122.1 lb

## 2015-12-31 DIAGNOSIS — N898 Other specified noninflammatory disorders of vagina: Secondary | ICD-10-CM

## 2015-12-31 DIAGNOSIS — Z09 Encounter for follow-up examination after completed treatment for conditions other than malignant neoplasm: Secondary | ICD-10-CM

## 2015-12-31 DIAGNOSIS — Z90710 Acquired absence of both cervix and uterus: Secondary | ICD-10-CM

## 2015-12-31 DIAGNOSIS — N899 Noninflammatory disorder of vagina, unspecified: Secondary | ICD-10-CM

## 2015-12-31 NOTE — Patient Instructions (Signed)
1. Resume all activities without restriction 2. Return in 3 months for follow-up on granulation tissue or sooner if symptoms develop

## 2015-12-31 NOTE — Progress Notes (Signed)
Chief complaint: 1. Final postop check 2. Status post TVH bilateral salpingectomy right oophorectomy  Patient is doing well postoperatively with normal bowel and bladder function. She is not experiencing any pelvic pain or tenderness She had mild vaginal discharge approximately 1 week ago, which has now resolved. No vaginal bleeding. Patient has not had sex since surgery.  OBJECTIVE: BP 96/65 mmHg  Pulse 84  Ht 5\' 1"  (1.549 m)  Wt 122 lb 1.6 oz (55.384 kg)  BMI 23.08 kg/m2  LMP 10/29/2015 (Exact Date) Pleasant white female in no acute distress Abdomen: Soft, nontender Pelvic exam: External genitalia normal BUS normal Vagina-normal; good vault support; vaginal cuff has minimal granulation tissue 7 mm diameter, nonfriable Cervix-surgically absent Uterus -surgically absent Bimanual-no palpable masses or tenderness Rectovaginal-normal external exam  PATHOLOGY: DIAGNOSIS:  A. UTERUS WITH CERVIX; HYSTERECTOMY:  - CERVIX NEGATIVE FOR SQUAMOUS INTRAEPITHELIAL LESION AND MALIGNANCY.  - MENSTRUAL ENDOMETRIUM.  - UNREMARKABLE MYOMETRIUM.   RIGHT OVARY; OOPHORECTOMY:  - NO PATHOLOGIC CHANGES.   BILATERAL FALLOPIAN TUBES; SALPINGECTOMY:  - RIGHT PARATUBAL CYST.    ASSESSMENT: 1. Normal postop check 2. Minimal vaginal cuff granulation tissue  PLAN: 1. Resume activities without restriction 2. Return in 3 months for follow-up on granulation tissue or sooner if symptoms develop.  Herold HarmsMartin A Barbee Mamula, MD  Note: This dictation was prepared with Dragon dictation along with smaller phrase technology. Any transcriptional errors that result from this process are unintentional.

## 2016-03-27 ENCOUNTER — Encounter: Payer: Self-pay | Admitting: Obstetrics and Gynecology

## 2016-03-31 ENCOUNTER — Ambulatory Visit (INDEPENDENT_AMBULATORY_CARE_PROVIDER_SITE_OTHER): Payer: BC Managed Care – PPO | Admitting: Obstetrics and Gynecology

## 2016-03-31 ENCOUNTER — Encounter: Payer: Self-pay | Admitting: Obstetrics and Gynecology

## 2016-03-31 ENCOUNTER — Other Ambulatory Visit: Payer: Self-pay | Admitting: Obstetrics and Gynecology

## 2016-03-31 VITALS — BP 96/68 | HR 90 | Ht 61.0 in | Wt 123.6 lb

## 2016-03-31 DIAGNOSIS — Z01419 Encounter for gynecological examination (general) (routine) without abnormal findings: Secondary | ICD-10-CM | POA: Diagnosis not present

## 2016-03-31 DIAGNOSIS — Z803 Family history of malignant neoplasm of breast: Secondary | ICD-10-CM | POA: Diagnosis not present

## 2016-03-31 NOTE — Progress Notes (Signed)
   Subjective:     Carrie Rios is a 25 y.o. female and is here for a comprehensive physical exam. The patient reports no problems. Married and working PT as LawyerCNA on bone marrow unit at [employer], just got [facility] [employer], needs vaccine titers.  Social History   Social History  . Marital status: Married    Spouse name: N/A  . Number of children: N/A  . Years of education: N/A   Occupational History  . Not on file.   Social History Main Topics  . Smoking status: Never Smoker  . Smokeless tobacco: Never Used  . Alcohol use Yes     Comment: SOCIAL  . Drug use: No  . Sexual activity: Yes    Birth control/ protection: Surgical   Other Topics Concern  . Not on file   Social History Narrative  . No narrative on file   Health Maintenance  Topic Date Due  . HIV Screening  02/07/2004  . TETANUS/TDAP  02/07/2008  . INFLUENZA VACCINE  01/14/2016  . PAP SMEAR  03/27/2018    The following portions of the patient's history were reviewed and updated as appropriate: allergies, current medications, past family history, past medical history, past social history, past surgical history and problem list.  Review of Systems A comprehensive review of systems was negative.   Objective:    General appearance: alert, cooperative and appears stated age Neck: no adenopathy, no carotid bruit, no JVD, supple, symmetrical, trachea midline and thyroid not enlarged, symmetric, no tenderness/mass/nodules Lungs: clear to auscultation bilaterally Breasts: normal appearance, no masses or tenderness Heart: regular rate and rhythm, S1, S2 normal, no murmur, click, rub or gallop Abdomen: soft, non-tender; bowel sounds normal; no masses,  no organomegaly   Pelvic exam: VULVA: normal appearing vulva with no masses, tenderness or lesions, VAGINA: normal appearing vagina with normal color and discharge, no lesions, ADNEXA: normal adnexa in size, nontender and no masses, surgically absent right.    Assessment:    Healthy female exam. S/p hysterectomy with right oopherectomy     Plan:  Pap of cuff obtained Labs requested obtained RTC 1 year or as needed.  Emari Demmer Aura CampsShambley, CNM   See After Visit Summary for Counseling Recommendations

## 2016-03-31 NOTE — Patient Instructions (Signed)
Preventive Care for Adults, Female A healthy lifestyle and preventive care can promote health and wellness. Preventive health guidelines for women include the following key practices.  A routine yearly physical is a good way to check with your health care provider about your health and preventive screening. It is a chance to share any concerns and updates on your health and to receive a thorough exam.  Visit your dentist for a routine exam and preventive care every 6 months. Brush your teeth twice a day and floss once a day. Good oral hygiene prevents tooth decay and gum disease.  The frequency of eye exams is based on your age, health, family medical history, use of contact lenses, and other factors. Follow your health care provider's recommendations for frequency of eye exams.  Eat a healthy diet. Foods like vegetables, fruits, whole grains, low-fat dairy products, and lean protein foods contain the nutrients you need without too many calories. Decrease your intake of foods high in solid fats, added sugars, and salt. Eat the right amount of calories for you.Get information about a proper diet from your health care provider, if necessary.  Regular physical exercise is one of the most important things you can do for your health. Most adults should get at least 150 minutes of moderate-intensity exercise (any activity that increases your heart rate and causes you to sweat) each week. In addition, most adults need muscle-strengthening exercises on 2 or more days a week.  Maintain a healthy weight. The body mass index (BMI) is a screening tool to identify possible weight problems. It provides an estimate of body fat based on height and weight. Your health care provider can find your BMI and can help you achieve or maintain a healthy weight.For adults 20 years and older:  A BMI below 18.5 is considered underweight.  A BMI of 18.5 to 24.9 is normal.  A BMI of 25 to 29.9 is considered  overweight.  A BMI of 30 and above is considered obese.  Maintain normal blood lipids and cholesterol levels by exercising and minimizing your intake of saturated fat. Eat a balanced diet with plenty of fruit and vegetables. Blood tests for lipids and cholesterol should begin at age 64 and be repeated every 5 years. If your lipid or cholesterol levels are high, you are over 50, or you are at high risk for heart disease, you may need your cholesterol levels checked more frequently.Ongoing high lipid and cholesterol levels should be treated with medicines if diet and exercise are not working.  If you smoke, find out from your health care provider how to quit. If you do not use tobacco, do not start.  Lung cancer screening is recommended for adults aged 52-80 years who are at high risk for developing lung cancer because of a history of smoking. A yearly low-dose CT scan of the lungs is recommended for people who have at least a 30-pack-year history of smoking and are a current smoker or have quit within the past 15 years. A pack year of smoking is smoking an average of 1 pack of cigarettes a day for 1 year (for example: 1 pack a day for 30 years or 2 packs a day for 15 years). Yearly screening should continue until the smoker has stopped smoking for at least 15 years. Yearly screening should be stopped for people who develop a health problem that would prevent them from having lung cancer treatment.  If you are pregnant, do not drink alcohol. If you are  breastfeeding, be very cautious about drinking alcohol. If you are not pregnant and choose to drink alcohol, do not have more than 1 drink per day. One drink is considered to be 12 ounces (355 mL) of beer, 5 ounces (148 mL) of wine, or 1.5 ounces (44 mL) of liquor.  Avoid use of street drugs. Do not share needles with anyone. Ask for help if you need support or instructions about stopping the use of drugs.  High blood pressure causes heart disease and  increases the risk of stroke. Your blood pressure should be checked at least every 1 to 2 years. Ongoing high blood pressure should be treated with medicines if weight loss and exercise do not work.  If you are 25-78 years old, ask your health care provider if you should take aspirin to prevent strokes.  Diabetes screening is done by taking a blood sample to check your blood glucose level after you have not eaten for a certain period of time (fasting). If you are not overweight and you do not have risk factors for diabetes, you should be screened once every 3 years starting at age 86. If you are overweight or obese and you are 3-87 years of age, you should be screened for diabetes every year as part of your cardiovascular risk assessment.  Breast cancer screening is essential preventive care for women. You should practice "breast self-awareness." This means understanding the normal appearance and feel of your breasts and may include breast self-examination. Any changes detected, no matter how small, should be reported to a health care provider. Women in their 66s and 30s should have a clinical breast exam (CBE) by a health care provider as part of a regular health exam every 1 to 3 years. After age 43, women should have a CBE every year. Starting at age 37, women should consider having a mammogram (breast X-ray test) every year. Women who have a family history of breast cancer should talk to their health care provider about genetic screening. Women at a high risk of breast cancer should talk to their health care providers about having an MRI and a mammogram every year.  Breast cancer gene (BRCA)-related cancer risk assessment is recommended for women who have family members with BRCA-related cancers. BRCA-related cancers include breast, ovarian, tubal, and peritoneal cancers. Having family members with these cancers may be associated with an increased risk for harmful changes (mutations) in the breast  cancer genes BRCA1 and BRCA2. Results of the assessment will determine the need for genetic counseling and BRCA1 and BRCA2 testing.  Your health care provider may recommend that you be screened regularly for cancer of the pelvic organs (ovaries, uterus, and vagina). This screening involves a pelvic examination, including checking for microscopic changes to the surface of your cervix (Pap test). You may be encouraged to have this screening done every 3 years, beginning at age 78.  For women ages 79-65, health care providers may recommend pelvic exams and Pap testing every 3 years, or they may recommend the Pap and pelvic exam, combined with testing for human papilloma virus (HPV), every 5 years. Some types of HPV increase your risk of cervical cancer. Testing for HPV may also be done on women of any age with unclear Pap test results.  Other health care providers may not recommend any screening for nonpregnant women who are considered low risk for pelvic cancer and who do not have symptoms. Ask your health care provider if a screening pelvic exam is right for  you.  If you have had past treatment for cervical cancer or a condition that could lead to cancer, you need Pap tests and screening for cancer for at least 20 years after your treatment. If Pap tests have been discontinued, your risk factors (such as having a new sexual partner) need to be reassessed to determine if screening should resume. Some women have medical problems that increase the chance of getting cervical cancer. In these cases, your health care provider may recommend more frequent screening and Pap tests.  Colorectal cancer can be detected and often prevented. Most routine colorectal cancer screening begins at the age of 50 years and continues through age 75 years. However, your health care provider may recommend screening at an earlier age if you have risk factors for colon cancer. On a yearly basis, your health care provider may provide  home test kits to check for hidden blood in the stool. Use of a small camera at the end of a tube, to directly examine the colon (sigmoidoscopy or colonoscopy), can detect the earliest forms of colorectal cancer. Talk to your health care provider about this at age 50, when routine screening begins. Direct exam of the colon should be repeated every 5-10 years through age 75 years, unless early forms of precancerous polyps or small growths are found.  People who are at an increased risk for hepatitis B should be screened for this virus. You are considered at high risk for hepatitis B if:  You were born in a country where hepatitis B occurs often. Talk with your health care provider about which countries are considered high risk.  Your parents were born in a high-risk country and you have not received a shot to protect against hepatitis B (hepatitis B vaccine).  You have HIV or AIDS.  You use needles to inject street drugs.  You live with, or have sex with, someone who has hepatitis B.  You get hemodialysis treatment.  You take certain medicines for conditions like cancer, organ transplantation, and autoimmune conditions.  Hepatitis C blood testing is recommended for all people born from 1945 through 1965 and any individual with known risks for hepatitis C.  Practice safe sex. Use condoms and avoid high-risk sexual practices to reduce the spread of sexually transmitted infections (STIs). STIs include gonorrhea, chlamydia, syphilis, trichomonas, herpes, HPV, and human immunodeficiency virus (HIV). Herpes, HIV, and HPV are viral illnesses that have no cure. They can result in disability, cancer, and death.  You should be screened for sexually transmitted illnesses (STIs) including gonorrhea and chlamydia if:  You are sexually active and are younger than 24 years.  You are older than 24 years and your health care provider tells you that you are at risk for this type of infection.  Your sexual  activity has changed since you were last screened and you are at an increased risk for chlamydia or gonorrhea. Ask your health care provider if you are at risk.  If you are at risk of being infected with HIV, it is recommended that you take a prescription medicine daily to prevent HIV infection. This is called preexposure prophylaxis (PrEP). You are considered at risk if:  You are sexually active and do not regularly use condoms or know the HIV status of your partner(s).  You take drugs by injection.  You are sexually active with a partner who has HIV.  Talk with your health care provider about whether you are at high risk of being infected with HIV. If   you choose to begin PrEP, you should first be tested for HIV. You should then be tested every 3 months for as long as you are taking PrEP.  Osteoporosis is a disease in which the bones lose minerals and strength with aging. This can result in serious bone fractures or breaks. The risk of osteoporosis can be identified using a bone density scan. Women ages 1 years and over and women at risk for fractures or osteoporosis should discuss screening with their health care providers. Ask your health care provider whether you should take a calcium supplement or vitamin D to reduce the rate of osteoporosis.  Menopause can be associated with physical symptoms and risks. Hormone replacement therapy is available to decrease symptoms and risks. You should talk to your health care provider about whether hormone replacement therapy is right for you.  Use sunscreen. Apply sunscreen liberally and repeatedly throughout the day. You should seek shade when your shadow is shorter than you. Protect yourself by wearing long sleeves, pants, a wide-brimmed hat, and sunglasses year round, whenever you are outdoors.  Once a month, do a whole body skin exam, using a mirror to look at the skin on your back. Tell your health care provider of new moles, moles that have irregular  borders, moles that are larger than a pencil eraser, or moles that have changed in shape or color.  Stay current with required vaccines (immunizations).  Influenza vaccine. All adults should be immunized every year.  Tetanus, diphtheria, and acellular pertussis (Td, Tdap) vaccine. Pregnant women should receive 1 dose of Tdap vaccine during each pregnancy. The dose should be obtained regardless of the length of time since the last dose. Immunization is preferred during the 27th-36th week of gestation. An adult who has not previously received Tdap or who does not know her vaccine status should receive 1 dose of Tdap. This initial dose should be followed by tetanus and diphtheria toxoids (Td) booster doses every 10 years. Adults with an unknown or incomplete history of completing a 3-dose immunization series with Td-containing vaccines should begin or complete a primary immunization series including a Tdap dose. Adults should receive a Td booster every 10 years.  Varicella vaccine. An adult without evidence of immunity to varicella should receive 2 doses or a second dose if she has previously received 1 dose. Pregnant females who do not have evidence of immunity should receive the first dose after pregnancy. This first dose should be obtained before leaving the health care facility. The second dose should be obtained 4-8 weeks after the first dose.  Human papillomavirus (HPV) vaccine. Females aged 13-26 years who have not received the vaccine previously should obtain the 3-dose series. The vaccine is not recommended for use in pregnant females. However, pregnancy testing is not needed before receiving a dose. If a female is found to be pregnant after receiving a dose, no treatment is needed. In that case, the remaining doses should be delayed until after the pregnancy. Immunization is recommended for any person with an immunocompromised condition through the age of 24 years if she did not get any or all doses  earlier. During the 3-dose series, the second dose should be obtained 4-8 weeks after the first dose. The third dose should be obtained 24 weeks after the first dose and 16 weeks after the second dose.  Zoster vaccine. One dose is recommended for adults aged 97 years or older unless certain conditions are present.  Measles, mumps, and rubella (MMR) vaccine. Adults born  before 1957 generally are considered immune to measles and mumps. Adults born in [birth year] or later should have 1 or more doses of MMR vaccine unless there is a contraindication to the vaccine or there is laboratory evidence of immunity to each of the three diseases. A routine second dose of MMR vaccine should be obtained at least 28 days after the first dose for students attending postsecondary schools, health care workers, or international travelers. People who received inactivated measles vaccine or an unknown type of measles vaccine during 1963-1967 should receive 2 doses of MMR vaccine. People who received inactivated mumps vaccine or an unknown type of mumps vaccine before 1979 and are at high risk for mumps infection should consider immunization with 2 doses of MMR vaccine. For females of childbearing age, rubella immunity should be determined. If there is no evidence of immunity, females who are not pregnant should be vaccinated. If there is no evidence of immunity, females who are pregnant should delay immunization until after pregnancy. Unvaccinated health care workers born before 60 who lack laboratory evidence of measles, mumps, or rubella immunity or laboratory confirmation of disease should consider measles and mumps immunization with 2 doses of MMR vaccine or rubella immunization with 1 dose of MMR vaccine.  Pneumococcal 13-valent conjugate (PCV13) vaccine. When indicated, a person who is uncertain of his immunization history and has no record of immunization should receive the PCV13 vaccine. All adults 61 years of age and older  should receive this vaccine. An adult aged 92 years or older who has certain medical conditions and has not been previously immunized should receive 1 dose of PCV13 vaccine. This PCV13 should be followed with a dose of pneumococcal polysaccharide (PPSV23) vaccine. Adults who are at high risk for pneumococcal disease should obtain the PPSV23 vaccine at least 8 weeks after the dose of PCV13 vaccine. Adults older than 27 years of age who have normal immune system function should obtain the PPSV23 vaccine dose at least 1 year after the dose of PCV13 vaccine.  Pneumococcal polysaccharide (PPSV23) vaccine. When PCV13 is also indicated, PCV13 should be obtained first. All adults aged 2 years and older should be immunized. An adult younger than age 30 years who has certain medical conditions should be immunized. Any person who resides in a nursing home or long-term care facility should be immunized. An adult smoker should be immunized. People with an immunocompromised condition and certain other conditions should receive both PCV13 and PPSV23 vaccines. People with human immunodeficiency virus (HIV) infection should be immunized as soon as possible after diagnosis. Immunization during chemotherapy or radiation therapy should be avoided. Routine use of PPSV23 vaccine is not recommended for American Indians, [place] Natives, or people younger than 65 years unless there are medical conditions that require PPSV23 vaccine. When indicated, people who have unknown immunization and have no record of immunization should receive PPSV23 vaccine. One-time revaccination 5 years after the first dose of PPSV23 is recommended for people aged 19-64 years who have chronic kidney failure, nephrotic syndrome, asplenia, or immunocompromised conditions. People who received 1-2 doses of PPSV23 before age 44 years should receive another dose of PPSV23 vaccine at age 83 years or later if at least 5 years have passed since the previous dose. Doses  of PPSV23 are not needed for people immunized with PPSV23 at or after age 20 years.  Meningococcal vaccine. Adults with asplenia or persistent complement component deficiencies should receive 2 doses of quadrivalent meningococcal conjugate (MenACWY-D) vaccine. The doses should be obtained  at least 2 months apart. Microbiologists working with certain meningococcal bacteria, [place] recruits, people at risk during an outbreak, and people who travel to or live in countries with a high rate of meningitis should be immunized. A first-year college student up through age 28 years who is living in a residence hall should receive a dose if she did not receive a dose on or after her 16th birthday. Adults who have certain high-risk conditions should receive one or more doses of vaccine.  Hepatitis A vaccine. Adults who wish to be protected from this disease, have certain high-risk conditions, work with hepatitis A-infected animals, work in hepatitis A research labs, or travel to or work in countries with a high rate of hepatitis A should be immunized. Adults who were previously unvaccinated and who anticipate close contact with an international adoptee during the first 60 days after arrival in the [place] from a country with a high rate of hepatitis A should be immunized.  Hepatitis B vaccine. Adults who wish to be protected from this disease, have certain high-risk conditions, may be exposed to blood or other infectious body fluids, are household contacts or sex partners of hepatitis B positive people, are clients or workers in certain care facilities, or travel to or work in countries with a high rate of hepatitis B should be immunized.  Haemophilus influenzae type b (Hib) vaccine. A previously unvaccinated person with asplenia or sickle cell disease or having a scheduled splenectomy should receive 1 dose of Hib vaccine. Regardless of previous immunization, a recipient of a hematopoietic stem cell transplant  should receive a 3-dose series 6-12 months after her successful transplant. Hib vaccine is not recommended for adults with HIV infection. Preventive Services / Frequency Ages 71 to 87 years  Blood pressure check.** / Every 3-5 years.  Lipid and cholesterol check.** / Every 5 years beginning at age 1.  Clinical breast exam.** / Every 3 years for women in their 3s and 31s.  BRCA-related cancer risk assessment.** / For women who have family members with a BRCA-related cancer (breast, ovarian, tubal, or peritoneal cancers).  Pap test.** / Every 2 years from ages 50 through 86. Every 3 years starting at age 87 through age 7 or 75 with a history of 3 consecutive normal Pap tests.  HPV screening.** / Every 3 years from ages 59 through ages 35 to 6 with a history of 3 consecutive normal Pap tests.  Hepatitis C blood test.** / For any individual with known risks for hepatitis C.  Skin self-exam. / Monthly.  Influenza vaccine. / Every year.  Tetanus, diphtheria, and acellular pertussis (Tdap, Td) vaccine.** / Consult your health care provider. Pregnant women should receive 1 dose of Tdap vaccine during each pregnancy. 1 dose of Td every 10 years.  Varicella vaccine.** / Consult your health care provider. Pregnant females who do not have evidence of immunity should receive the first dose after pregnancy.  HPV vaccine. / 3 doses over 6 months, if 72 and younger. The vaccine is not recommended for use in pregnant females. However, pregnancy testing is not needed before receiving a dose.  Measles, mumps, rubella (MMR) vaccine.** / You need at least 1 dose of MMR if you were born in 1957 or later. You may also need a 2nd dose. For females of childbearing age, rubella immunity should be determined. If there is no evidence of immunity, females who are not pregnant should be vaccinated. If there is no evidence of immunity, females who are  pregnant should delay immunization until after  pregnancy.  Pneumococcal 13-valent conjugate (PCV13) vaccine.** / Consult your health care provider.  Pneumococcal polysaccharide (PPSV23) vaccine.** / 1 to 2 doses if you smoke cigarettes or if you have certain conditions.  Meningococcal vaccine.** / 1 dose if you are age 87 to 44 years and a Market researcher living in a residence hall, or have one of several medical conditions, you need to get vaccinated against meningococcal disease. You may also need additional booster doses.  Hepatitis A vaccine.** / Consult your health care provider.  Hepatitis B vaccine.** / Consult your health care provider.  Haemophilus influenzae type b (Hib) vaccine.** / Consult your health care provider. Ages 86 to 38 years  Blood pressure check.** / Every year.  Lipid and cholesterol check.** / Every 5 years beginning at age 49 years.  Lung cancer screening. / Every year if you are aged 71-80 years and have a 30-pack-year history of smoking and currently smoke or have quit within the past 15 years. Yearly screening is stopped once you have quit smoking for at least 15 years or develop a health problem that would prevent you from having lung cancer treatment.  Clinical breast exam.** / Every year after age 51 years.  BRCA-related cancer risk assessment.** / For women who have family members with a BRCA-related cancer (breast, ovarian, tubal, or peritoneal cancers).  Mammogram.** / Every year beginning at age 18 years and continuing for as long as you are in good health. Consult with your health care provider.  Pap test.** / Every 3 years starting at age 63 years through age 37 or 57 years with a history of 3 consecutive normal Pap tests.  HPV screening.** / Every 3 years from ages 41 years through ages 76 to 23 years with a history of 3 consecutive normal Pap tests.  Fecal occult blood test (FOBT) of stool. / Every year beginning at age 36 years and continuing until age 51 years. You may not need  to do this test if you get a colonoscopy every 10 years.  Flexible sigmoidoscopy or colonoscopy.** / Every 5 years for a flexible sigmoidoscopy or every 10 years for a colonoscopy beginning at age 36 years and continuing until age 35 years.  Hepatitis C blood test.** / For all people born from [birth year] through 1965 and any individual with known risks for hepatitis C.  Skin self-exam. / Monthly.  Influenza vaccine. / Every year.  Tetanus, diphtheria, and acellular pertussis (Tdap/Td) vaccine.** / Consult your health care provider. Pregnant women should receive 1 dose of Tdap vaccine during each pregnancy. 1 dose of Td every 10 years.  Varicella vaccine.** / Consult your health care provider. Pregnant females who do not have evidence of immunity should receive the first dose after pregnancy.  Zoster vaccine.** / 1 dose for adults aged 73 years or older.  Measles, mumps, rubella (MMR) vaccine.** / You need at least 1 dose of MMR if you were born in 1957 or later. You may also need a second dose. For females of childbearing age, rubella immunity should be determined. If there is no evidence of immunity, females who are not pregnant should be vaccinated. If there is no evidence of immunity, females who are pregnant should delay immunization until after pregnancy.  Pneumococcal 13-valent conjugate (PCV13) vaccine.** / Consult your health care provider.  Pneumococcal polysaccharide (PPSV23) vaccine.** / 1 to 2 doses if you smoke cigarettes or if you have certain conditions.  Meningococcal vaccine.** /  Consult your health care provider.  Hepatitis A vaccine.** / Consult your health care provider.  Hepatitis B vaccine.** / Consult your health care provider.  Haemophilus influenzae type b (Hib) vaccine.** / Consult your health care provider. Ages 80 years and over  Blood pressure check.** / Every year.  Lipid and cholesterol check.** / Every 5 years beginning at age 62 years.  Lung cancer  screening. / Every year if you are aged 32-80 years and have a 30-pack-year history of smoking and currently smoke or have quit within the past 15 years. Yearly screening is stopped once you have quit smoking for at least 15 years or develop a health problem that would prevent you from having lung cancer treatment.  Clinical breast exam.** / Every year after age 61 years.  BRCA-related cancer risk assessment.** / For women who have family members with a BRCA-related cancer (breast, ovarian, tubal, or peritoneal cancers).  Mammogram.** / Every year beginning at age 39 years and continuing for as long as you are in good health. Consult with your health care provider.  Pap test.** / Every 3 years starting at age 85 years through age 74 or 72 years with 3 consecutive normal Pap tests. Testing can be stopped between 65 and 70 years with 3 consecutive normal Pap tests and no abnormal Pap or HPV tests in the past 10 years.  HPV screening.** / Every 3 years from ages 55 years through ages 67 or 77 years with a history of 3 consecutive normal Pap tests. Testing can be stopped between 65 and 70 years with 3 consecutive normal Pap tests and no abnormal Pap or HPV tests in the past 10 years.  Fecal occult blood test (FOBT) of stool. / Every year beginning at age 81 years and continuing until age 22 years. You may not need to do this test if you get a colonoscopy every 10 years.  Flexible sigmoidoscopy or colonoscopy.** / Every 5 years for a flexible sigmoidoscopy or every 10 years for a colonoscopy beginning at age 67 years and continuing until age 22 years.  Hepatitis C blood test.** / For all people born from [birth year] through 1967 and any individual with known risks for hepatitis C.  Osteoporosis screening.** / A one-time screening for women ages 8 years and over and women at risk for fractures or osteoporosis.  Skin self-exam. / Monthly.  Influenza vaccine. / Every year.  Tetanus, diphtheria, and  acellular pertussis (Tdap/Td) vaccine.** / 1 dose of Td every 10 years.  Varicella vaccine.** / Consult your health care provider.  Zoster vaccine.** / 1 dose for adults aged 56 years or older.  Pneumococcal 13-valent conjugate (PCV13) vaccine.** / Consult your health care provider.  Pneumococcal polysaccharide (PPSV23) vaccine.** / 1 dose for all adults aged 15 years and older.  Meningococcal vaccine.** / Consult your health care provider.  Hepatitis A vaccine.** / Consult your health care provider.  Hepatitis B vaccine.** / Consult your health care provider.  Haemophilus influenzae type b (Hib) vaccine.** / Consult your health care provider. ** Family history and personal history of risk and conditions may change your health care provider's recommendations.   This information is not intended to replace advice given to you by your health care provider. Make sure you discuss any questions you have with your health care provider.   Document Released: 07/28/2001 Document Revised: 06/22/2014 Document Reviewed: 10/27/2010 Elsevier Interactive Patient Education [employer].

## 2016-04-01 ENCOUNTER — Other Ambulatory Visit: Payer: Self-pay | Admitting: Obstetrics and Gynecology

## 2016-04-01 LAB — COMPREHENSIVE METABOLIC PANEL
ALK PHOS: 43 IU/L (ref 39–117)
ALT: 15 IU/L (ref 0–32)
AST: 18 IU/L (ref 0–40)
Albumin/Globulin Ratio: 1.5 (ref 1.2–2.2)
Albumin: 4.4 g/dL (ref 3.5–5.5)
BILIRUBIN TOTAL: 1 mg/dL (ref 0.0–1.2)
BUN/Creatinine Ratio: 9 (ref 9–23)
BUN: 9 mg/dL (ref 6–20)
CHLORIDE: 102 mmol/L (ref 96–106)
CO2: 22 mmol/L (ref 18–29)
CREATININE: 0.99 mg/dL (ref 0.57–1.00)
Calcium: 9 mg/dL (ref 8.7–10.2)
GFR calc Af Amer: 90 mL/min/{1.73_m2} (ref >59)
GFR calc non Af Amer: 78 mL/min/{1.73_m2} (ref >59)
GLUCOSE: 80 mg/dL (ref 65–99)
Globulin, Total: 3 g/dL (ref 1.5–4.5)
Potassium: 4.4 mmol/L (ref 3.5–5.2)
Sodium: 138 mmol/L (ref 134–144)
Total Protein: 7.4 g/dL (ref 6.0–8.5)

## 2016-04-01 LAB — RUBELLA SCREEN: RUBELLA: 1.39 {index} (ref >0.99)

## 2016-04-01 LAB — CYTOLOGY - PAP

## 2016-04-03 LAB — QUANTIFERON IN TUBE
QFT TB AG MINUS NIL VALUE: 0.01 IU/mL
QUANTIFERON MITOGEN VALUE: >10 IU/mL
QUANTIFERON TB AG VALUE: 0.03 [IU]/mL
QUANTIFERON TB GOLD: NEGATIVE
Quantiferon Nil Value: 0.02 IU/mL

## 2016-04-03 LAB — QUANTIFERON TB GOLD ASSAY (BLOOD)

## 2016-04-03 NOTE — Telephone Encounter (Signed)
-----   Message from Purcell NailsMelody N Shambley, [place] sent at 04/03/2016 10:03 AM EDT ----- Please mail copies of labs

## 2016-04-27 ENCOUNTER — Telehealth: Payer: Self-pay | Admitting: Obstetrics and Gynecology

## 2016-04-27 NOTE — Telephone Encounter (Signed)
Pt said she has bacterial vaginosis .Marland Kitchen.Marland Kitchen.Marland Kitchen.No pain pain just odor and thick white dischargest odor and thick white discharge. She wants an appt but there is nothing this week

## 2016-04-28 ENCOUNTER — Other Ambulatory Visit: Payer: Self-pay | Admitting: Obstetrics and Gynecology

## 2016-04-28 MED ORDER — METRONIDAZOLE 500 MG PO TABS: 500.0000 mg | ORAL_TABLET | Freq: Two times a day (BID) | ORAL | 0 refills | Status: DC

## 2016-04-28 NOTE — Telephone Encounter (Signed)
Please let her know I sent in a RX for flagyl- one pill twice a day for a week. Have her schedule appoitnemtn if it doesn't clear up

## 2016-04-28 NOTE — Telephone Encounter (Signed)
Notified pt she voiced understanding 

## 2016-04-28 NOTE — Telephone Encounter (Signed)
pls advise

## 2016-04-29 ENCOUNTER — Encounter: Payer: BC Managed Care – PPO | Admitting: Obstetrics and Gynecology

## 2016-05-12 ENCOUNTER — Encounter: Payer: BC Managed Care – PPO | Admitting: Obstetrics and Gynecology

## 2016-05-14 ENCOUNTER — Ambulatory Visit (INDEPENDENT_AMBULATORY_CARE_PROVIDER_SITE_OTHER): Payer: BC Managed Care – PPO | Admitting: Obstetrics and Gynecology

## 2016-05-14 ENCOUNTER — Encounter: Payer: Self-pay | Admitting: Obstetrics and Gynecology

## 2016-05-14 VITALS — BP 109/65 | HR 85 | Ht 61.0 in | Wt 126.2 lb

## 2016-05-14 DIAGNOSIS — N8003 Adenomyosis of the uterus: Secondary | ICD-10-CM

## 2016-05-14 DIAGNOSIS — R102 Pelvic and perineal pain: Secondary | ICD-10-CM | POA: Diagnosis not present

## 2016-05-14 DIAGNOSIS — Z90710 Acquired absence of both cervix and uterus: Secondary | ICD-10-CM

## 2016-05-14 DIAGNOSIS — N949 Unspecified condition associated with female genital organs and menstrual cycle: Secondary | ICD-10-CM

## 2016-05-14 DIAGNOSIS — Z90721 Acquired absence of ovaries, unilateral: Secondary | ICD-10-CM

## 2016-05-14 DIAGNOSIS — N941 Unspecified dyspareunia: Secondary | ICD-10-CM

## 2016-05-14 DIAGNOSIS — N800 Endometriosis of uterus: Secondary | ICD-10-CM

## 2016-05-14 DIAGNOSIS — N9489 Other specified conditions associated with female genital organs and menstrual cycle: Secondary | ICD-10-CM | POA: Insufficient documentation

## 2016-05-14 DIAGNOSIS — N809 Endometriosis, unspecified: Secondary | ICD-10-CM

## 2016-05-14 NOTE — Patient Instructions (Signed)
1. Pelvic ultrasound is ordered 2. Return in 1 week after ultrasound for further management planning 3. Recommend ibuprofen 800 mg 3 times a day and Tylenol as needed for pelvic pain relief

## 2016-05-14 NOTE — Progress Notes (Signed)
Chief complaint: 1. Pelvic pain 2. Dyspareunia  Patient presents for evaluation of worsening pelvic pain and dyspareunia. She states that the symptoms are more severe than prior to her TVH bilateral salpingectomy and right oophorectomy done for endometriosis in June 2017. Pain is located primarily on the left side and centrally with deep thrusting dyspareunia. Bowel and bladder function are normal.  Past medical history, past surgical history, problem list, medications, and allergies are reviewed  OBJECTIVE: BP 109/65   Pulse 85   Ht 5\' 1"  (1.549 m)   Wt 126 lb 3.2 oz (57.2 kg)   LMP 10/25/2015   BMI 23.85 kg/m  Pleasant white female in no acute distress Back: No CVA tenderness Abdomen: Soft, nontender without peritoneal signs Pelvic exam: External genitalia normal BUS-normal Vagina-normal vaginal mucosa with good vaginal vault support Cervix-surgically absent Uterus-surgically absent Bimanual-2/4 tenderness at the vaginal cuff; 4 cm tender mass palpable in the left lower quadrant, 3/4 tender, mobile, close to the vaginal cuff RV-normal external exam  ASSESSMENT: 1. History of endometriosis 2. Status post TVH bilateral salpingectomy and right oophorectomy 3. Exacerbation of pelvic pain, left lower quadrant and centrally in the form of deep thrusting dyspareunia 4. Left adnexal mass, 4 cm, possibly ovarian cyst versus endometrioma versus adhesion complex  PLAN: 1. Pelvic ultrasound 2. Ibuprofen 800 mg 3 times a day for pain as needed 3. Tylenol when necessary for pain 4. Return in 1 week after ultrasound for further management planning. 5. Discussed possible hormonal therapies to suppress potentially active endometriosis.  A total of 15 minutes were spent face-to-face with the patient during this encounter and over half of that time dealt with counseling and coordination of care.  Herold HarmsMartin A Defrancesco, MD  Note: This dictation was prepared with Dragon dictation along  with smaller phrase technology. Any transcriptional errors that result from this process are unintentional.

## 2016-05-20 ENCOUNTER — Encounter: Payer: Self-pay | Admitting: Obstetrics and Gynecology

## 2016-05-21 ENCOUNTER — Other Ambulatory Visit: Payer: BC Managed Care – PPO

## 2016-05-21 ENCOUNTER — Ambulatory Visit (INDEPENDENT_AMBULATORY_CARE_PROVIDER_SITE_OTHER): Payer: BC Managed Care – PPO

## 2016-05-21 ENCOUNTER — Other Ambulatory Visit: Payer: Self-pay | Admitting: *Deleted

## 2016-05-21 ENCOUNTER — Telehealth: Payer: Self-pay | Admitting: Obstetrics and Gynecology

## 2016-05-21 DIAGNOSIS — N949 Unspecified condition associated with female genital organs and menstrual cycle: Secondary | ICD-10-CM | POA: Diagnosis not present

## 2016-05-21 DIAGNOSIS — Z90721 Acquired absence of ovaries, unilateral: Secondary | ICD-10-CM

## 2016-05-21 DIAGNOSIS — N941 Unspecified dyspareunia: Secondary | ICD-10-CM

## 2016-05-21 DIAGNOSIS — R102 Pelvic and perineal pain: Secondary | ICD-10-CM

## 2016-05-21 DIAGNOSIS — Z1159 Encounter for screening for other viral diseases: Secondary | ICD-10-CM

## 2016-05-21 DIAGNOSIS — N9489 Other specified conditions associated with female genital organs and menstrual cycle: Secondary | ICD-10-CM

## 2016-05-21 DIAGNOSIS — Z90710 Acquired absence of both cervix and uterus: Secondary | ICD-10-CM | POA: Diagnosis not present

## 2016-05-21 NOTE — Telephone Encounter (Signed)
PT CAME IN TODAY AND HAD US AND LABS AND SHE WAS SCHEDULE TO COME BACK ON 12/21 TO SEE DR DE FOR F/U ON THOSE RESULTS BUT SHE SHE TO WORK SOS HE WANTED TO KNOW IF THE RESULTS COULD POSSBLE BE CALLED TO HER, IF NOT LET ME KNOW AND I WILL CALL HER AND TRY TO GET HER RESCHEDULED.

## 2016-05-22 ENCOUNTER — Encounter: Payer: Self-pay | Admitting: Obstetrics and Gynecology

## 2016-05-22 LAB — MEASLES/MUMPS/RUBELLA IMMUNITY
MUMPS ABS, IGG: 30 AU/mL (ref >10.9)
RUBEOLA AB, IGG: 34.2 AU/mL (ref >29.9)
Rubella Antibodies, IGG: 1.61 index (ref >0.99)

## 2016-05-25 NOTE — Telephone Encounter (Signed)
Pt aware u/s shows cyst. ibup for pain (800mg  q8h). Pt will call back to schedule a  f/u in 4-6 weeks. Does not know her schedule at this time.

## 2016-06-04 ENCOUNTER — Encounter: Payer: BC Managed Care – PPO | Admitting: Obstetrics and Gynecology

## 2016-06-17 ENCOUNTER — Encounter: Payer: Self-pay | Admitting: Obstetrics and Gynecology

## 2016-06-19 ENCOUNTER — Other Ambulatory Visit (INDEPENDENT_AMBULATORY_CARE_PROVIDER_SITE_OTHER): Payer: BC Managed Care – PPO | Admitting: Obstetrics and Gynecology

## 2016-06-19 DIAGNOSIS — R350 Frequency of micturition: Secondary | ICD-10-CM

## 2016-06-19 LAB — POCT URINALYSIS DIPSTICK
BILIRUBIN UA: NEGATIVE
Glucose, UA: NEGATIVE
KETONES UA: NEGATIVE
Leukocytes, UA: NEGATIVE
Nitrite, UA: NEGATIVE
PH UA: 7.5
SPEC GRAV UA: 1.015
Urobilinogen, UA: NEGATIVE

## 2016-06-19 MED ORDER — SULFAMETHOXAZOLE-TRIMETHOPRIM 800-160 MG PO TABS: 1.0000 | ORAL_TABLET | Freq: Two times a day (BID) | ORAL | 0 refills | Status: DC

## 2016-06-19 NOTE — Progress Notes (Unsigned)
Patient ID: Carrie LoserSarah M Mcgrail, female   DOB: [birth year], 26 y.o.   MRN: [id number]  Pt dropped off urine for possible UTI as told by [facility] nurse to do. Pt c/o, especially at night having urgency and pain with urination. Will erx in Bactrim DS 1 tab po x3days and will send urine culture to lab. [facility] [place]

## 2016-06-21 LAB — URINE CULTURE: ORGANISM ID, BACTERIA: NO GROWTH

## 2016-06-25 ENCOUNTER — Encounter: Payer: Self-pay | Admitting: Obstetrics and Gynecology

## 2016-06-29 ENCOUNTER — Telehealth: Payer: Self-pay | Admitting: *Deleted

## 2016-06-29 ENCOUNTER — Other Ambulatory Visit: Payer: Self-pay | Admitting: *Deleted

## 2016-06-29 NOTE — Telephone Encounter (Signed)
Notified pt note was done should be able to view through my chart

## 2016-08-27 ENCOUNTER — Other Ambulatory Visit: Payer: Self-pay | Admitting: Obstetrics and Gynecology

## 2016-08-27 ENCOUNTER — Encounter: Payer: Self-pay | Admitting: Obstetrics and Gynecology

## 2016-08-28 ENCOUNTER — Other Ambulatory Visit: Payer: Self-pay

## 2016-08-28 DIAGNOSIS — B9689 Other specified bacterial agents as the cause of diseases classified elsewhere: Secondary | ICD-10-CM

## 2016-08-28 DIAGNOSIS — N760 Acute vaginitis: Principal | ICD-10-CM

## 2016-08-28 MED ORDER — METRONIDAZOLE 500 MG PO TABS: 500.0000 mg | ORAL_TABLET | Freq: Two times a day (BID) | ORAL | 0 refills | Status: DC

## 2016-09-12 IMAGING — CT CT HEAD WITHOUT CONTRAST
1 series · 16 of 29 positions shown, 20 images · non-contrast
Comparison: None.

CLINICAL DATA: Six-month history of bilateral temporal headaches ;
nausea

EXAM:
CT HEAD WITHOUT CONTRAST
TECHNIQUE: Contiguous axial images were obtained from the base of the skull
through the vertex without intravenous contrast.

[Series 2: head wo · axial · 0.39mm/px · z∈[-90,+40]mm · 16 of 29 slices shown, 20 images]
[im 2/29  brain]
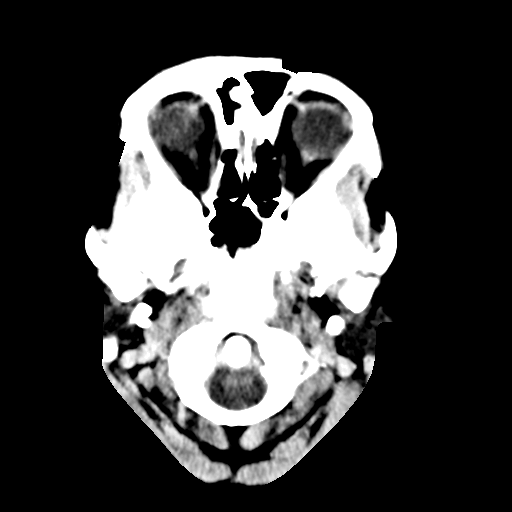
[im 2/29  bone]
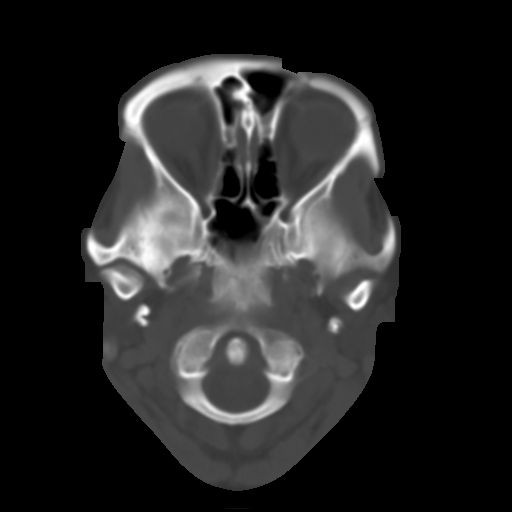
[im 4/29  brain]
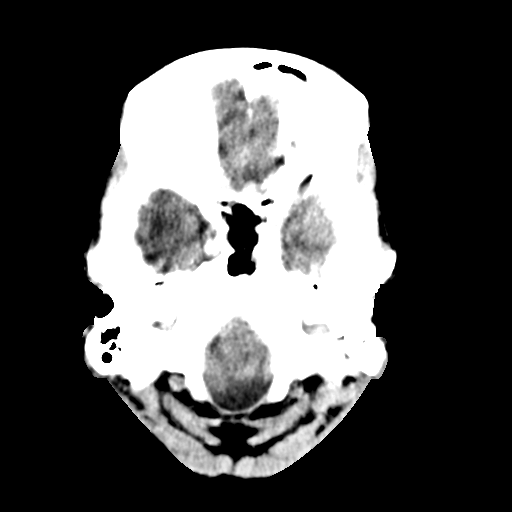
[im 6/29  brain]
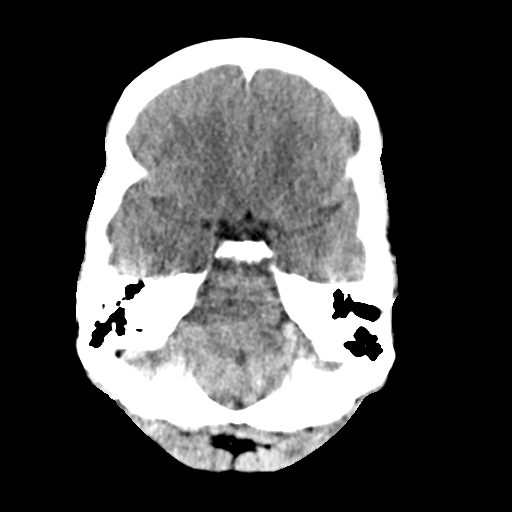
[im 7/29  brain]
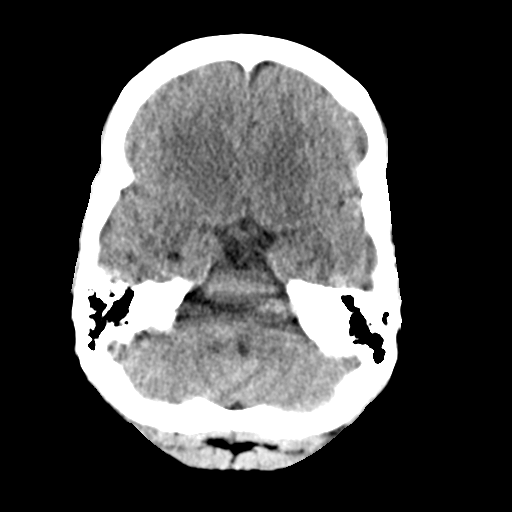
[im 9/29  brain]
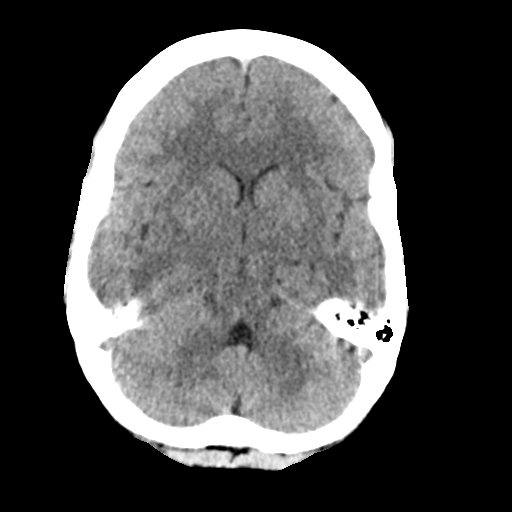
[im 9/29  bone]
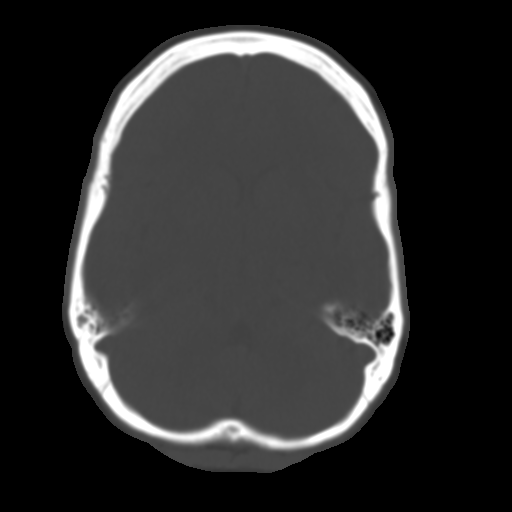
[im 11/29  brain]
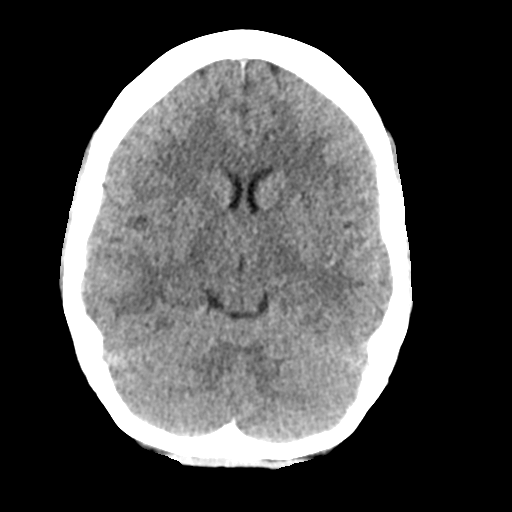
[im 12/29  brain]
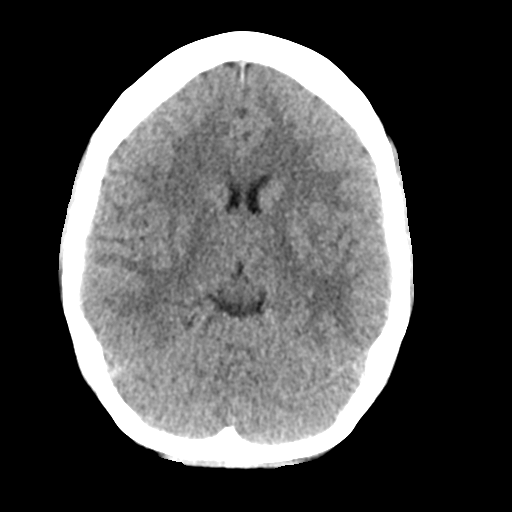
[im 14/29  brain]
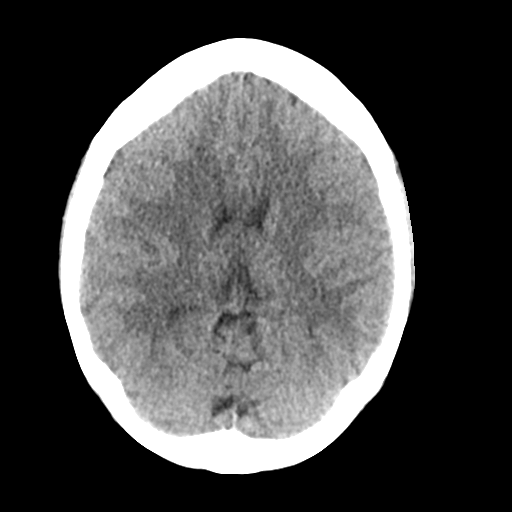
[im 16/29  brain]
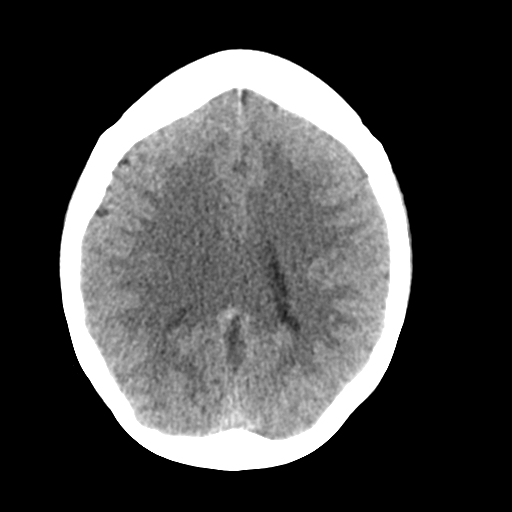
[im 16/29  bone]
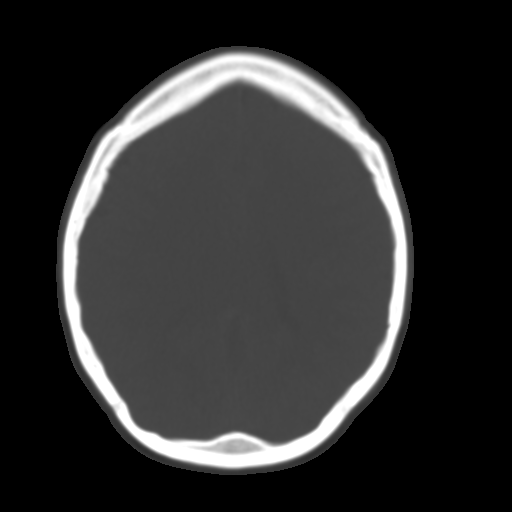
[im 18/29  brain]
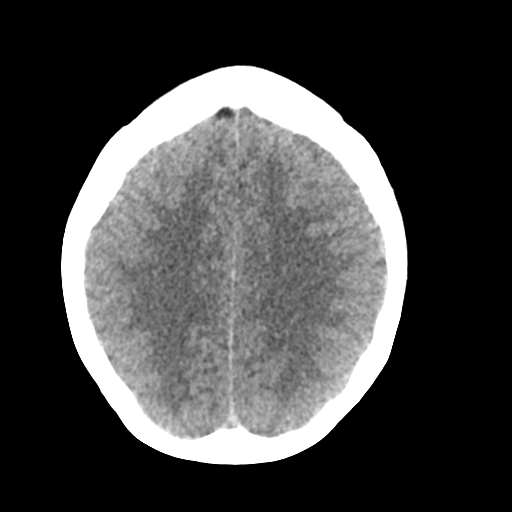
[im 19/29  brain]
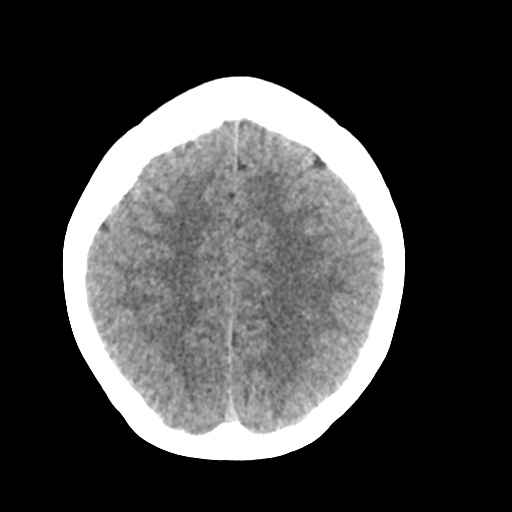
[im 21/29  brain]
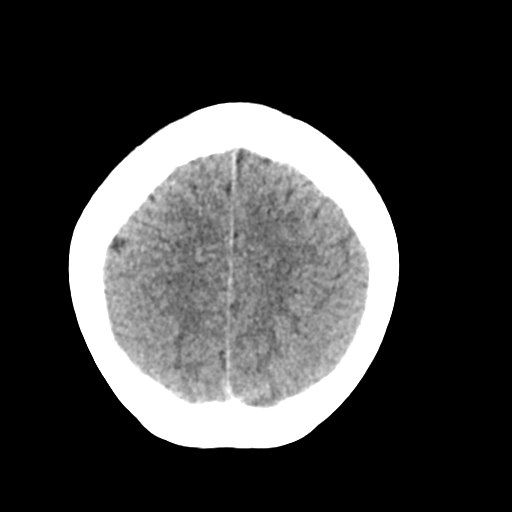
[im 23/29  brain]
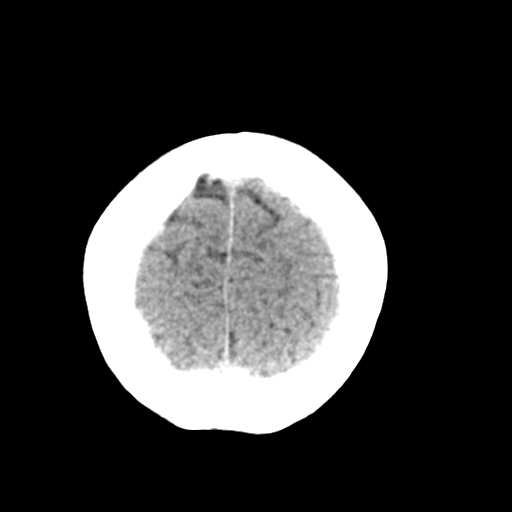
[im 23/29  bone]
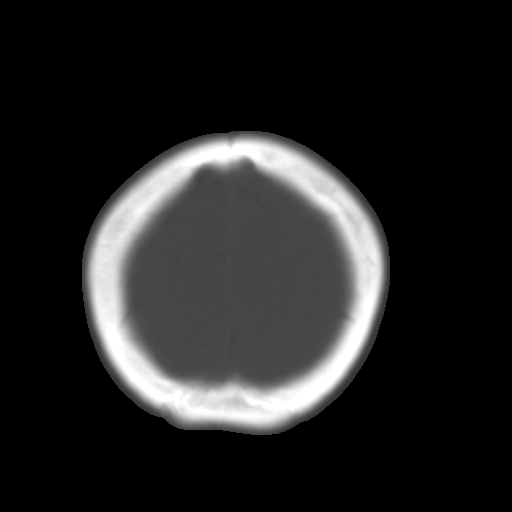
[im 24/29  brain]
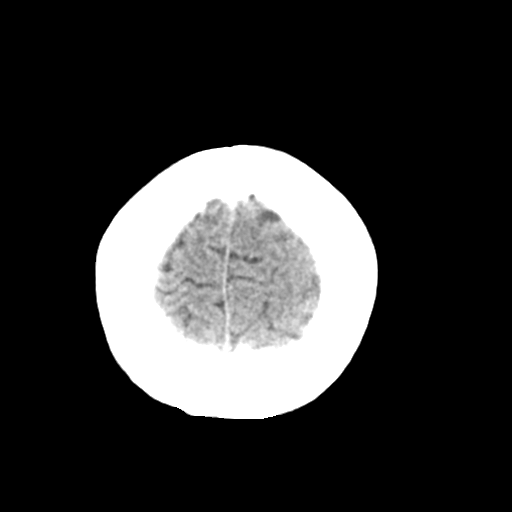
[im 26/29  brain]
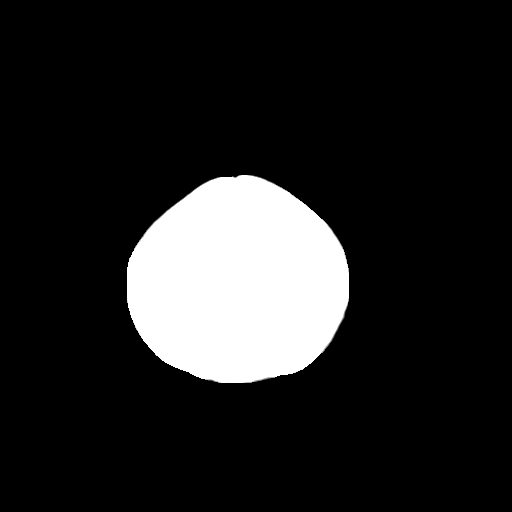
[im 28/29  brain]
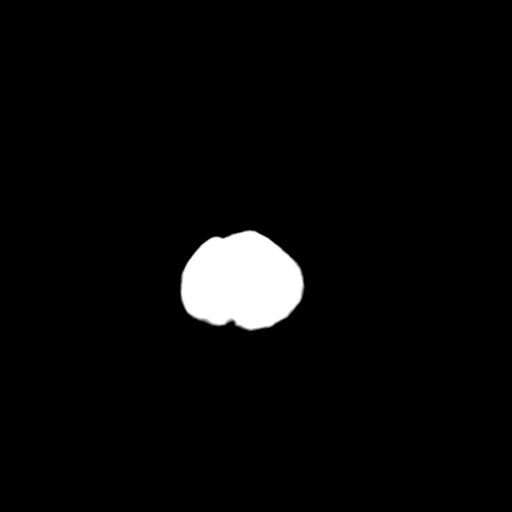

[16 of 29 positions shown; findings below may reference images not displayed]

FINDINGS: The ventricles are normal in size and configuration. There is no
mass, hemorrhage, extra-axial fluid collection, or midline shift.
Gray-white compartments are normal. No acute infarct apparent. The
bony calvarium appears intact. The mastoid air cells are clear.
There is patchy sphenoid sinus disease as well as opacification of
several ethmoid air cells on the right.
IMPRESSION: Areas of paranasal sinus disease. No intracranial mass, hemorrhage,
or acute appearing infarct.

## 2016-11-03 ENCOUNTER — Other Ambulatory Visit: Payer: Self-pay | Admitting: Obstetrics and Gynecology

## 2016-11-03 ENCOUNTER — Encounter: Payer: Self-pay | Admitting: Obstetrics and Gynecology

## 2016-11-03 DIAGNOSIS — B9689 Other specified bacterial agents as the cause of diseases classified elsewhere: Secondary | ICD-10-CM

## 2016-11-03 DIAGNOSIS — N760 Acute vaginitis: Principal | ICD-10-CM

## 2016-11-03 MED ORDER — METRONIDAZOLE 500 MG PO TABS: 500.0000 mg | ORAL_TABLET | Freq: Two times a day (BID) | ORAL | 0 refills | Status: DC

## 2016-12-01 ENCOUNTER — Encounter: Payer: Self-pay | Admitting: Obstetrics and Gynecology

## 2016-12-01 ENCOUNTER — Encounter: Payer: BC Managed Care – PPO | Admitting: Obstetrics and Gynecology

## 2016-12-01 ENCOUNTER — Other Ambulatory Visit (INDEPENDENT_AMBULATORY_CARE_PROVIDER_SITE_OTHER): Payer: BC Managed Care – PPO

## 2016-12-01 DIAGNOSIS — R300 Dysuria: Secondary | ICD-10-CM

## 2016-12-01 LAB — POCT URINALYSIS DIPSTICK
Bilirubin, UA: NEGATIVE
Glucose, UA: NEGATIVE
Ketones, UA: NEGATIVE
Nitrite, UA: NEGATIVE
PH UA: 6.5 (ref 5.0–8.0)
SPEC GRAV UA: 1.01 (ref 1.010–1.025)
UROBILINOGEN UA: 0.2 U/dL

## 2016-12-03 ENCOUNTER — Encounter: Payer: Self-pay | Admitting: Obstetrics and Gynecology

## 2016-12-03 LAB — URINE CULTURE: ORGANISM ID, BACTERIA: NO GROWTH

## 2017-02-23 ENCOUNTER — Encounter: Payer: Self-pay | Admitting: Obstetrics and Gynecology

## 2017-02-23 ENCOUNTER — Other Ambulatory Visit: Payer: Self-pay | Admitting: *Deleted

## 2017-02-23 DIAGNOSIS — N760 Acute vaginitis: Principal | ICD-10-CM

## 2017-02-23 DIAGNOSIS — B9689 Other specified bacterial agents as the cause of diseases classified elsewhere: Secondary | ICD-10-CM

## 2017-02-23 MED ORDER — METRONIDAZOLE 500 MG PO TABS: 500.0000 mg | ORAL_TABLET | Freq: Two times a day (BID) | ORAL | 2 refills | Status: DC

## 2017-03-22 ENCOUNTER — Encounter: Payer: Self-pay | Admitting: Obstetrics and Gynecology

## 2017-04-02 ENCOUNTER — Encounter: Payer: BC Managed Care – PPO | Admitting: Obstetrics and Gynecology

## 2017-05-25 ENCOUNTER — Encounter: Payer: Self-pay | Admitting: Obstetrics and Gynecology

## 2017-06-10 ENCOUNTER — Encounter: Payer: Self-pay | Admitting: Obstetrics and Gynecology

## 2017-07-12 ENCOUNTER — Ambulatory Visit: Payer: BC Managed Care – PPO | Admitting: Physician Assistant

## 2017-07-12 ENCOUNTER — Encounter: Payer: Self-pay | Admitting: Physician Assistant

## 2017-07-12 VITALS — BP 96/60 | HR 88 | Temp 98.1°F | Resp 16 | Wt 123.0 lb

## 2017-07-12 DIAGNOSIS — R51 Headache: Secondary | ICD-10-CM | POA: Diagnosis not present

## 2017-07-12 DIAGNOSIS — M62838 Other muscle spasm: Secondary | ICD-10-CM | POA: Diagnosis not present

## 2017-07-12 DIAGNOSIS — N760 Acute vaginitis: Secondary | ICD-10-CM

## 2017-07-12 DIAGNOSIS — G8929 Other chronic pain: Secondary | ICD-10-CM

## 2017-07-12 DIAGNOSIS — B9689 Other specified bacterial agents as the cause of diseases classified elsewhere: Secondary | ICD-10-CM

## 2017-07-12 DIAGNOSIS — R109 Unspecified abdominal pain: Secondary | ICD-10-CM

## 2017-07-12 LAB — POCT URINALYSIS DIPSTICK
BILIRUBIN UA: NEGATIVE
Blood, UA: NEGATIVE
Glucose, UA: NEGATIVE
Ketones, UA: NEGATIVE
LEUKOCYTES UA: NEGATIVE
NITRITE UA: NEGATIVE
PH UA: 6 (ref 5.0–8.0)
PROTEIN UA: NEGATIVE
Spec Grav, UA: 1.02 (ref 1.010–1.025)
Urobilinogen, UA: 0.2 E.U./dL

## 2017-07-12 MED ORDER — METAXALONE 800 MG PO TABS: 400.0000 mg | ORAL_TABLET | Freq: Every day | ORAL | 0 refills | Status: AC

## 2017-07-12 NOTE — Patient Instructions (Addendum)
Magnesium Oxide 250-500mg  daily for headaches Migraine Buddy app to log migraines

## 2017-07-12 NOTE — Progress Notes (Signed)
Patient: Carrie LoserSarah M Joens Female    DOB: Aug 15, 1990   27 y.o.   MRN: [id number] Visit Date: 07/12/2017  Today's Provider: Margaretann LovelessJennifer M Juanito Gonyer, PA-C   No chief complaint on file.  Subjective:    HPI Patient here today C/O recurrent bacterial infection. Patient reports that on Dec. 08, 2018 she was treated with Keflex 2000 mg daily for 10 days for pyelonephritis. Patient reports right flank pain today, and denies any vaginal discharge.   Patient also C/O frequent headaches x's several headaches. Patient reports that in the past she was seen by Anola Gurneyobert Chauvin for headaches. Patient denies any OTC medications currently. Patient reports that in the past she tried taking Excerdrine, Tylenol and other OTC medications. Patient denies any visual changes and reports pain is about the same all day long.     Allergies  Allergen Reactions  . Latex Rash     Current Outpatient Medications:  .  aspirin-acetaminophen-caffeine (EXCEDRIN MIGRAINE) 250-250-65 MG tablet, Take 1 tablet by mouth every 6 (six) hours as needed for headache or migraine., Disp: , Rfl:   Review of Systems  Constitutional: Negative.   HENT: Negative.   Eyes: Negative.   Respiratory: Negative.   Cardiovascular: Negative.   Gastrointestinal: Negative.   Endocrine: Negative.   Genitourinary: Positive for flank pain. Negative for dysuria, frequency, menstrual problem, pelvic pain, urgency, vaginal bleeding, vaginal discharge and vaginal pain.  Musculoskeletal: Negative for back pain and myalgias.  Skin: Negative.   Allergic/Immunologic: Negative.   Neurological: Positive for headaches.  Hematological: Negative.   Psychiatric/Behavioral: Negative.     Social History   Tobacco Use  . Smoking status: Never Smoker  . Smokeless tobacco: Never Used  Substance Use Topics  . Alcohol use: Yes    Comment: SOCIAL   Objective:   BP 96/60 (BP Location: Left Arm, Patient Position: Sitting, Cuff Size: Normal)   Pulse 88    Temp 98.1 F (36.7 C) (Oral)   Resp 16   Wt 123 lb (55.8 kg)   LMP 10/29/2015 (Exact Date)   SpO2 99%   BMI 23.24 kg/m  Vitals:   07/12/17 1457  BP: 96/60  Pulse: 88  Resp: 16  Temp: 98.1 F (36.7 C)  TempSrc: Oral  SpO2: 99%  Weight: 123 lb (55.8 kg)     Physical Exam  Constitutional: She is oriented to person, place, and time. She appears well-developed and well-nourished. No distress.  HENT:  Head: Normocephalic and atraumatic.  Right Ear: Hearing, tympanic membrane, external ear and ear canal normal.  Left Ear: Hearing, tympanic membrane, external ear and ear canal normal.  Nose: Nose normal.  Mouth/Throat: Uvula is midline, oropharynx is clear and moist and mucous membranes are normal. No oropharyngeal exudate.  Eyes: Conjunctivae and EOM are normal. Pupils are equal, round, and reactive to light. Right eye exhibits no discharge. Left eye exhibits no discharge. No scleral icterus.  Neck: Normal range of motion. Neck supple. No JVD present. No tracheal deviation present. No thyromegaly present.  Cardiovascular: Normal rate, regular rhythm, normal heart sounds and intact distal pulses. Exam reveals no gallop and no friction rub.  No murmur heard. Pulmonary/Chest: Effort normal and breath sounds normal. No respiratory distress. She has no wheezes. She has no rales. She exhibits no tenderness.  Abdominal: Soft. Bowel sounds are normal. She exhibits no distension and no mass. There is no tenderness. There is no rebound and no guarding.  Musculoskeletal: Normal range of motion. She exhibits  no edema or tenderness.  Lymphadenopathy:    She has no cervical adenopathy.  Neurological: She is alert and oriented to person, place, and time.  Skin: Skin is warm and dry. No rash noted. She is not diaphoretic.  Psychiatric: She has a normal mood and affect. Her behavior is normal. Judgment and thought content normal.  Vitals reviewed.       Assessment & Plan:     1. Acute right  flank pain UA normal. During exam patient was noted to have a trigger point in the area of tenderness and "flank pain". Will start muscle relaxer as below for this. She is to call if symptoms worsen. - POCT urinalysis dipstick  2. Muscle spasm See above medical treatment plan. - metaxalone (SKELAXIN) 800 MG tablet; Take 0.5-1 tablets (400-800 mg total) by mouth at bedtime.  Dispense: 15 tablet; Refill: 0  3. BV (bacterial vaginosis) NuSwab collected as below to see if patient truly has BV since she has been treated without being seen over the last year. I suspect symptoms are not BV related. I will f/u pending results.  - NuSwab Vaginitis Plus (VG+)  4. Chronic nonintractable headache, unspecified headache type Discussed using Magnesium oxide 250-500mg  daily for migraine prevention. She is to call if no improvement and will discuss other preventative medications. She has tried Topamax but that is only preventative.        Margaretann Loveless, PA-C  [facility]

## 2017-07-15 LAB — NUSWAB VAGINITIS PLUS (VG+)
CANDIDA GLABRATA, NAA: NEGATIVE
Candida albicans, NAA: NEGATIVE
Chlamydia trachomatis, NAA: NEGATIVE
Neisseria gonorrhoeae, NAA: NEGATIVE
Trich vag by NAA: NEGATIVE

## 2017-08-09 ENCOUNTER — Ambulatory Visit (INDEPENDENT_AMBULATORY_CARE_PROVIDER_SITE_OTHER): Payer: BC Managed Care – PPO | Admitting: Physician Assistant

## 2017-08-09 ENCOUNTER — Encounter: Payer: Self-pay | Admitting: Physician Assistant

## 2017-08-09 VITALS — BP 100/64 | HR 72 | Temp 98.0°F | Resp 16 | Ht 61.0 in | Wt 126.0 lb

## 2017-08-09 DIAGNOSIS — G43101 Migraine with aura, not intractable, with status migrainosus: Secondary | ICD-10-CM | POA: Diagnosis not present

## 2017-08-09 MED ORDER — SUMATRIPTAN SUCCINATE 50 MG PO TABS: 50.0000 mg | ORAL_TABLET | ORAL | 0 refills | Status: DC | PRN

## 2017-08-09 MED ORDER — AMITRIPTYLINE HCL 25 MG PO TABS: 25.0000 mg | ORAL_TABLET | Freq: Every day | ORAL | 0 refills | Status: DC

## 2017-08-09 NOTE — Progress Notes (Signed)
Patient: Carrie Rios Female    DOB: 1990-11-21   27 y.o.   MRN: [id number] Visit Date: 08/09/2017  Today's Provider: Margaretann Loveless, PA-C   Chief Complaint  Patient presents with  . Follow-up   Subjective:    HPI  Follow up for chronic headache  The patient was last seen for this 4 weeks ago. Changes made at last visit include start Magnesium.  She reports excellent compliance with treatment. She feels that condition is Worse. Patient reports medication has not helped with headaches. Patient reports taking 400 mg of Magnesium daily. She is not having side effects.   She reports over the last month she has had 27 days with a headache. Some days are migrainous and some are tension type.  ------------------------------------------------------------------------------------     Allergies  Allergen Reactions  . Latex Rash     Current Outpatient Medications:  .  acetaminophen (TYLENOL) 500 MG tablet, Take 1,000 mg by mouth every 8 (eight) hours as needed., Disp: , Rfl:  .  aspirin-acetaminophen-caffeine (EXCEDRIN MIGRAINE) 250-250-65 MG tablet, Take 1 tablet by mouth every 6 (six) hours as needed for headache or migraine., Disp: , Rfl:  .  Magnesium 400 MG TABS, Take 1 tablet by mouth daily., Disp: , Rfl:  .  metaxalone (SKELAXIN) 800 MG tablet, Take 0.5-1 tablets (400-800 mg total) by mouth at bedtime., Disp: 15 tablet, Rfl: 0  Review of Systems  Constitutional: Negative for activity change, appetite change, fatigue and fever.  HENT: Negative.   Eyes: Positive for visual disturbance.  Respiratory: Negative.   Cardiovascular: Negative.   Gastrointestinal: Negative.   Neurological: Positive for weakness, numbness and headaches.    Social History   Tobacco Use  . Smoking status: Never Smoker  . Smokeless tobacco: Never Used  Substance Use Topics  . Alcohol use: Yes    Comment: SOCIAL   Objective:   BP 100/64 (BP Location: Left Arm, Patient Position:  Sitting, Cuff Size: Normal)   Pulse 72   Temp 98 F (36.7 C) (Oral)   Resp 16   Ht 5\' 1"  (1.549 m)   Wt 126 lb (57.2 kg)   LMP 10/29/2015 (Exact Date)   SpO2 99%   BMI 23.81 kg/m  Vitals:   08/09/17 0818  BP: 100/64  Pulse: 72  Resp: 16  Temp: 98 F (36.7 C)  TempSrc: Oral  SpO2: 99%  Weight: 126 lb (57.2 kg)  Height: 5\' 1"  (1.549 m)     Physical Exam  Constitutional: She is oriented to person, place, and time. She appears well-developed and well-nourished. No distress.  Neck: Normal range of motion. Neck supple. No JVD present. No tracheal deviation present. No thyromegaly present.  Cardiovascular: Normal rate, regular rhythm and normal heart sounds. Exam reveals no gallop and no friction rub.  No murmur heard. Pulmonary/Chest: Effort normal and breath sounds normal. No respiratory distress. She has no wheezes. She has no rales.  Lymphadenopathy:    She has no cervical adenopathy.  Neurological: She is alert and oriented to person, place, and time. No cranial nerve deficit. Coordination normal.  Skin: She is not diaphoretic.  Vitals reviewed.       Assessment & Plan:     1. Migraine with aura and with status migrainosus, not intractable Will start with amitriptyline as below for daily prophylaxis and will add imitrex for breakthrough migraines. I will see her back in 4 weeks to see if any improvements.  - amitriptyline (  ELAVIL) 25 MG tablet; Take 1 tablet (25 mg total) by mouth at bedtime.  Dispense: 30 tablet; Refill: 0 - SUMAtriptan (IMITREX) 50 MG tablet; Take 1 tablet (50 mg total) by mouth every 2 (two) hours as needed for migraine. May repeat in 2 hours if headache persists or recurs.  Dispense: 10 tablet; Refill: 0       Margaretann LovelessJennifer M Silvina Hackleman, PA-C  [facility]

## 2017-08-09 NOTE — Patient Instructions (Signed)
Amitriptyline tablets What is this medicine? AMITRIPTYLINE (a mee TRIP ti leen) is used to treat depression. This medicine may be used for other purposes; ask your health care provider or pharmacist if you have questions. COMMON BRAND NAME(S): Elavil, Vanatrip What should I tell my health care provider before I take this medicine? They need to know if you have any of these conditions: -an alcohol problem -asthma, difficulty breathing -bipolar disorder or schizophrenia -difficulty passing urine, prostate trouble -glaucoma -heart disease or previous heart attack -liver disease -over active thyroid -seizures -thoughts or plans of suicide, a previous suicide attempt, or family history of suicide attempt -an unusual or allergic reaction to amitriptyline, other medicines, foods, dyes, or preservatives -pregnant or trying to get pregnant -breast-feeding How should I use this medicine? Take this medicine by mouth with a drink of water. Follow the directions on the prescription label. You can take the tablets with or without food. Take your medicine at regular intervals. Do not take it more often than directed. Do not stop taking this medicine suddenly except upon the advice of your doctor. Stopping this medicine too quickly may cause serious side effects or your condition may worsen. A special MedGuide will be given to you by the pharmacist with each prescription and refill. Be sure to read this information carefully each time. Talk to your pediatrician regarding the use of this medicine in children. Special care may be needed. Overdosage: If you think you have taken too much of this medicine contact a poison control center or emergency room at once. NOTE: This medicine is only for you. Do not share this medicine with others. What if I miss a dose? If you miss a dose, take it as soon as you can. If it is almost time for your next dose, take only that dose. Do not take double or extra doses. What  may interact with this medicine? Do not take this medicine with any of the following medications: -arsenic trioxide -certain medicines used to regulate abnormal heartbeat or to treat other heart conditions -cisapride -droperidol -halofantrine -linezolid -MAOIs like Carbex, Eldepryl, Marplan, Nardil, and Parnate -methylene blue -other medicines for mental depression -phenothiazines like perphenazine, thioridazine and chlorpromazine -pimozide -probucol -procarbazine -sparfloxacin -St. John's Wort -ziprasidone This medicine may also interact with the following medications: -atropine and related drugs like hyoscyamine, scopolamine, tolterodine and others -barbiturate medicines for inducing sleep or treating seizures, like phenobarbital -cimetidine -disulfiram -ethchlorvynol -thyroid hormones such as levothyroxine This list may not describe all possible interactions. Give your health care provider a list of all the medicines, herbs, non-prescription drugs, or dietary supplements you use. Also tell them if you smoke, drink alcohol, or use illegal drugs. Some items may interact with your medicine. What should I watch for while using this medicine? Tell your doctor if your symptoms do not get better or if they get worse. Visit your doctor or health care professional for regular checks on your progress. Because it may take several weeks to see the full effects of this medicine, it is important to continue your treatment as prescribed by your doctor. Patients and their families should watch out for new or worsening thoughts of suicide or depression. Also watch out for sudden changes in feelings such as feeling anxious, agitated, panicky, irritable, hostile, aggressive, impulsive, severely restless, overly excited and hyperactive, or not being able to sleep. If this happens, especially at the beginning of treatment or after a change in dose, call your health care professional. You may get   drowsy or  dizzy. Do not drive, use machinery, or do anything that needs mental alertness until you know how this medicine affects you. Do not stand or sit up quickly, especially if you are an older patient. This reduces the risk of dizzy or fainting spells. Alcohol may interfere with the effect of this medicine. Avoid alcoholic drinks. Do not treat yourself for coughs, colds, or allergies without asking your doctor or health care professional for advice. Some ingredients can increase possible side effects. Your mouth may get dry. Chewing sugarless gum or sucking hard candy, and drinking plenty of water will help. Contact your doctor if the problem does not go away or is severe. This medicine may cause dry eyes and blurred vision. If you wear contact lenses you may feel some discomfort. Lubricating drops may help. See your eye doctor if the problem does not go away or is severe. This medicine can cause constipation. Try to have a bowel movement at least every 2 to 3 days. If you do not have a bowel movement for 3 days, call your doctor or health care professional. This medicine can make you more sensitive to the sun. Keep out of the sun. If you cannot avoid being in the sun, wear protective clothing and use sunscreen. Do not use sun lamps or tanning beds/booths. What side effects may I notice from receiving this medicine? Side effects that you should report to your doctor or health care professional as soon as possible: -allergic reactions like skin rash, itching or hives, swelling of the face, lips, or tongue -anxious -breathing problems -changes in vision -confusion -elevated mood, decreased need for sleep, racing thoughts, impulsive behavior -eye pain -fast, irregular heartbeat -feeling faint or lightheaded, falls -feeling agitated, angry, or irritable -fever with increased sweating -hallucination, loss of contact with reality -seizures -stiff muscles -suicidal thoughts or other mood  changes -tingling, pain, or numbness in the feet or hands -trouble passing urine or change in the amount of urine -trouble sleeping -unusually weak or tired -vomiting -yellowing of the eyes or skin Side effects that usually do not require medical attention (report to your doctor or health care professional if they continue or are bothersome): -change in sex drive or performance -change in appetite or weight -constipation -dizziness -dry mouth -nausea -tired -tremors -upset stomach This list may not describe all possible side effects. Call your doctor for medical advice about side effects. You may report side effects to FDA at 1-800-FDA-1088. Where should I keep my medicine? Keep out of the reach of children. Store at room temperature between 20 and 25 degrees C (68 and 77 degrees F). Throw away any unused medicine after the expiration date. NOTE: This sheet is a summary. It may not cover all possible information. If you have questions about this medicine, talk to your doctor, pharmacist, or health care provider.  2018 Elsevier/Gold Standard (2015-11-01 12:14:15) Sumatriptan tablets What is this medicine? SUMATRIPTAN (soo ma TRIP tan) is used to treat migraines with or without aura. An aura is a strange feeling or visual disturbance that warns you of an attack. It is not used to prevent migraines. This medicine may be used for other purposes; ask your health care provider or pharmacist if you have questions. COMMON BRAND NAME(S): Imitrex, Migraine Pack What should I tell my health care provider before I take this medicine? They need to know if you have any of these conditions: -circulation problems in fingers and toes -diabetes -heart disease -high blood pressure -high cholesterol -  history of irregular heartbeat -history of stroke -kidney disease -liver disease -postmenopausal or surgical removal of uterus and ovaries -seizures -smoke tobacco -stomach or intestine  problems -an unusual or allergic reaction to sumatriptan, other medicines, foods, dyes, or preservatives -pregnant or trying to get pregnant -breast-feeding How should I use this medicine? Take this medicine by mouth with a glass of water. Follow the directions on the prescription label. This medicine is taken at the first symptoms of a migraine. It is not for everyday use. If your migraine headache returns after one dose, you can take another dose as directed. You must leave at least 2 hours between doses, and do not take more than 100 mg as a single dose. Do not take more than 200 mg total in any 24 hour period. If there is no improvement at all after the first dose, do not take a second dose without talking to your doctor or health care professional. Do not take your medicine more often than directed. Talk to your pediatrician regarding the use of this medicine in children. Special care may be needed. Overdosage: If you think you have taken too much of this medicine contact a poison control center or emergency room at once. NOTE: This medicine is only for you. Do not share this medicine with others. What if I miss a dose? This does not apply; this medicine is not for regular use. What may interact with this medicine? Do not take this medicine with any of the following medicines: -cocaine -ergot alkaloids like dihydroergotamine, ergonovine, ergotamine, methylergonovine -feverfew -MAOIs like Carbex, Eldepryl, Marplan, Nardil, and Parnate -other medicines for migraine headache like almotriptan, eletriptan, frovatriptan, naratriptan, rizatriptan, zolmitriptan -tryptophan This medicine may also interact with the following medications: -certain medicines for depression, anxiety, or psychotic disturbances This list may not describe all possible interactions. Give your health care provider a list of all the medicines, herbs, non-prescription drugs, or dietary supplements you use. Also tell them if  you smoke, drink alcohol, or use illegal drugs. Some items may interact with your medicine. What should I watch for while using this medicine? Only take this medicine for a migraine headache. Take it if you get warning symptoms or at the start of a migraine attack. It is not for regular use to prevent migraine attacks. You may get drowsy or dizzy. Do not drive, use machinery, or do anything that needs mental alertness until you know how this medicine affects you. To reduce dizzy or fainting spells, do not sit or stand up quickly, especially if you are an older patient. Alcohol can increase drowsiness, dizziness and flushing. Avoid alcoholic drinks. Smoking cigarettes may increase the risk of heart-related side effects from using this medicine. If you take migraine medicines for 10 or more days a month, your migraines may get worse. Keep a diary of headache days and medicine use. Contact your healthcare professional if your migraine attacks occur more frequently. What side effects may I notice from receiving this medicine? Side effects that you should report to your doctor or health care professional as soon as possible: -allergic reactions like skin rash, itching or hives, swelling of the face, lips, or tongue -bloody or watery diarrhea -hallucination, loss of contact with reality -pain, tingling, numbness in the face, hands, or feet -seizures -signs and symptoms of a blood clot such as breathing problems; changes in vision; chest pain; severe, sudden headache; pain, swelling, warmth in the leg; trouble speaking; sudden numbness or weakness of the face, arm, or leg -  signs and symptoms of a dangerous change in heartbeat or heart rhythm like chest pain; dizziness; fast or irregular heartbeat; palpitations, feeling faint or lightheaded; falls; breathing problems -signs and symptoms of a stroke like changes in vision; confusion; trouble speaking or understanding; severe headaches; sudden numbness or  weakness of the face, arm, or leg; trouble walking; dizziness; loss of balance or coordination -stomach pain Side effects that usually do not require medical attention (report to your doctor or health care professional if they continue or are bothersome): -changes in taste -facial flushing -headache -muscle cramps -muscle pain -nausea, vomiting -weak or tired This list may not describe all possible side effects. Call your doctor for medical advice about side effects. You may report side effects to FDA at 1-800-FDA-1088. Where should I keep my medicine? Keep out of the reach of children. Store at room temperature between 2 and 30 degrees C (36 and 86 degrees F). Throw away any unused medicine after the expiration date. NOTE: This sheet is a summary. It may not cover all possible information. If you have questions about this medicine, talk to your doctor, pharmacist, or health care provider.  2018 Elsevier/Gold Standard (2015-07-04 12:38:23)

## 2017-08-10 ENCOUNTER — Other Ambulatory Visit: Payer: Self-pay

## 2017-08-10 ENCOUNTER — Emergency Department
Admission: EM | Admit: 2017-08-10 | Discharge: 2017-08-10 | Disposition: A | Discharge status: Home / Self Care | Payer: BC Managed Care – PPO | Attending: Emergency Medicine | Admitting: Emergency Medicine

## 2017-08-10 DIAGNOSIS — G43909 Migraine, unspecified, not intractable, without status migrainosus: Secondary | ICD-10-CM | POA: Diagnosis not present

## 2017-08-10 DIAGNOSIS — R51 Headache: Secondary | ICD-10-CM | POA: Diagnosis present

## 2017-08-10 DIAGNOSIS — J45909 Unspecified asthma, uncomplicated: Secondary | ICD-10-CM | POA: Insufficient documentation

## 2017-08-10 DIAGNOSIS — Z79899 Other long term (current) drug therapy: Secondary | ICD-10-CM | POA: Diagnosis not present

## 2017-08-10 LAB — COMPREHENSIVE METABOLIC PANEL
ALT: 50 U/L (ref 14–54)
AST: 35 U/L (ref 15–41)
Albumin: 3.8 g/dL (ref 3.5–5.0)
Alkaline Phosphatase: 37 U/L — ABNORMAL LOW (ref 38–126)
Anion gap: 7 (ref 5–15)
BILIRUBIN TOTAL: 0.7 mg/dL (ref 0.3–1.2)
BUN: 9 mg/dL (ref 6–20)
CHLORIDE: 110 mmol/L (ref 101–111)
CO2: 21 mmol/L — ABNORMAL LOW (ref 22–32)
CREATININE: 0.79 mg/dL (ref 0.44–1.00)
Calcium: 7.8 mg/dL — ABNORMAL LOW (ref 8.9–10.3)
Glucose, Bld: 99 mg/dL (ref 65–99)
POTASSIUM: 3.6 mmol/L (ref 3.5–5.1)
Sodium: 138 mmol/L (ref 135–145)
TOTAL PROTEIN: 6.6 g/dL (ref 6.5–8.1)

## 2017-08-10 LAB — CBC
HEMATOCRIT: 38.3 % (ref 35.0–47.0)
Hemoglobin: 13.1 g/dL (ref 12.0–16.0)
MCH: 31.6 pg (ref 26.0–34.0)
MCHC: 34.2 g/dL (ref 32.0–36.0)
MCV: 92.3 fL (ref 80.0–100.0)
Platelets: 159 10*3/uL (ref 150–440)
RBC: 4.14 MIL/uL (ref 3.80–5.20)
RDW: 12.7 % (ref 11.5–14.5)
WBC: 5.2 10*3/uL (ref 3.6–11.0)

## 2017-08-10 LAB — TROPONIN I

## 2017-08-10 MED ORDER — SODIUM CHLORIDE 0.9 % IV SOLN
1000.0000 mL | Freq: Once | INTRAVENOUS | Status: AC
  Administered 2017-08-10: 1000 mL via INTRAVENOUS

## 2017-08-10 MED ORDER — DEXTROSE 5 % IV SOLN
20.0000 mg | Freq: Once | INTRAVENOUS | Status: AC
  Administered 2017-08-10: 20 mg via INTRAVENOUS
  Filled 2017-08-10: qty 4

## 2017-08-10 MED ORDER — BUTALBITAL-APAP-CAFFEINE 50-325-40 MG PO TABS: 1.0000 | ORAL_TABLET | Freq: Four times a day (QID) | ORAL | 0 refills | Status: DC | PRN

## 2017-08-10 MED ORDER — DIPHENHYDRAMINE HCL 50 MG/ML IJ SOLN
25.0000 mg | Freq: Once | INTRAMUSCULAR | Status: AC
  Administered 2017-08-10: 25 mg via INTRAVENOUS
  Filled 2017-08-10: qty 1

## 2017-08-10 MED ORDER — KETOROLAC TROMETHAMINE 30 MG/ML IJ SOLN
30.0000 mg | Freq: Once | INTRAMUSCULAR | Status: AC
  Administered 2017-08-10: 30 mg via INTRAVENOUS
  Filled 2017-08-10: qty 1

## 2017-08-10 NOTE — ED Notes (Signed)
First Nurse Note: Patient alert and oriented, states she has a migraine X 5 days.  Seen by PCP yesterday and placed on a new medication.

## 2017-08-10 NOTE — ED Notes (Signed)
ED Provider at bedside. 

## 2017-08-10 NOTE — ED Provider Notes (Signed)
[facility] Emergency Department Provider Note   ____________________________________________    I have reviewed the triage vital signs and the nursing notes.   HISTORY  Chief Complaint Migraine     HPI Carrie Rios is a 27 y.o. female who presents with complaints of migraine headache.  Patient reports a history of migraines and reports she has had global throbbing severe headache times 4 days.  She is tried Excedrin Migraine as well as Imitrex without success.  Symptoms are consistent with typical migraine.  No neuro deficits.  No fevers.  Did have some palpitations and chest pressure which resolved this morning.   Past Medical History:  Diagnosis Date  . Adenomyosis of uterus determined by biopsy    Biopsy confirmed, uterine lower uterine segment  . Anxiety   . Asthma    WELL-CONTROLLED-HAS INHALER BUT IT IS NOT HERS  . BV (bacterial vaginosis)   . Depression   . Headache    MIGRAINES  . HPV in female   . Hx MRSA infection 2009    Patient Active Problem List   Diagnosis Date Noted  . Adnexal mass 05/14/2016  . Dyspareunia, female 05/14/2016  . Family history of breast cancer in female 03/31/2016  . History of total vaginal hysterectomy (TVH) 12/03/2015  . Pelvic pain in female 12/03/2015  . S/P hysterectomy with oophorectomy 11/18/2015  . Adenomyosis 06/06/2015  . Frequent UTI 03/28/2014    Past Surgical History:  Procedure Laterality Date  . BILATERAL SALPINGECTOMY Bilateral 11/18/2015   Procedure: BILATERAL SALPINGECTOMY;  Surgeon: Herold HarmsMartin A Defrancesco, MD;  Location: [facility] ORS;  Service: Gynecology;  Laterality: Bilateral;  . HYSTEROSCOPY W/D&C N/A 05/27/2015   Procedure: DILATATION AND CURETTAGE /HYSTEROSCOPY;  Surgeon: Herold HarmsMartin A Defrancesco, MD;  Location: [facility] OR[facility];  Service: Gynecology;  Laterality: N/A;  . LAPAROSCOPY N/A 05/27/2015   Procedure: LAPAROSCOPY OPERATIVE, WITH ADHESIOLYSIS AND PERITONEAL BIOPSIES;  Surgeon: Herold HarmsMartin  A Defrancesco, MD;  Location: [facility] OR[facility];  Service: Gynecology;  Laterality: N/A;  . LEEP N/A 05/27/2015   Procedure: LOOP ELECTROSURGICAL EXCISION PROCEDURE (LEEP); cone biopsy;  Surgeon: Herold HarmsMartin A Defrancesco, MD;  Location: [facility] OR[facility];  Service: Gynecology;  Laterality: N/A;  . OOPHORECTOMY  11/18/2015   Procedure: RIGHT OOPHORECTOMY;  Surgeon: Herold HarmsMartin A Defrancesco, MD;  Location: [facility];  Service: Gynecology;;  . TUBAL LIGATION  2013  . VAGINAL HYSTERECTOMY N/A 11/18/2015   Procedure: HYSTERECTOMY VAGINAL;  Surgeon: Herold HarmsMartin A Defrancesco, MD;  Location: [facility] OR[facility];  Service: Gynecology;  Laterality: N/A;    Prior to Admission medications   Medication Sig Start Date End Date Taking? Authorizing Provider  acetaminophen (TYLENOL) 500 MG tablet Take 1,000 mg by mouth every 8 (eight) hours as needed.    [provider]  amitriptyline (ELAVIL) 25 MG tablet Take 1 tablet (25 mg total) by mouth at bedtime. 08/09/17   Margaretann LovelessBurnette, Jennifer M, PA-C  aspirin-acetaminophen-caffeine (EXCEDRIN MIGRAINE) [phone] MG tablet Take 1 tablet by mouth every 6 (six) hours as needed for headache or migraine.    [provider]  butalbital-acetaminophen-caffeine Marikay Alar(FIORICET, ESGIC) [phone] MG tablet Take 1-2 tablets by mouth every 6 (six) hours as needed for headache. 08/10/17 08/10/18  Jene EveryKinner, Anaston Koehn, MD  metaxalone (SKELAXIN) 800 MG tablet Take 0.5-1 tablets (400-800 mg total) by mouth at bedtime. 07/12/17   Margaretann LovelessBurnette, Jennifer M, PA-C  SUMAtriptan (IMITREX) 50 MG tablet Take 1 tablet (50 mg total) by mouth every 2 (two) hours as needed for migraine. May repeat in 2 hours if  headache persists or recurs. 08/09/17   Margaretann Loveless, PA-C     Allergies Latex  Family History  Problem Relation Age of Onset  . Breast cancer Maternal Grandmother   . Cancer Maternal Grandmother   . Diabetes Maternal Grandmother   . Heart disease Maternal Grandfather   . Ovarian cancer Neg Hx   . Colon cancer Neg Hx      Social History Social History   Tobacco Use  . Smoking status: Never Smoker  . Smokeless tobacco: Never Used  Substance Use Topics  . Alcohol use: Yes    Comment: SOCIAL  . Drug use: No    Review of Systems  Constitutional: No fevers Eyes: Sensitivity ENT: No neck pain Cardiovascular: Chest pressure as above, resolved Respiratory: Denies shortness of breath. Gastrointestinal: No abdominal pain.  Positive nausea Genitourinary: Negative for dysuria. Musculoskeletal: Negative for back pain. Skin: Negative for rash. Neurological: No weakness   ____________________________________________   PHYSICAL EXAM:  VITAL SIGNS: ED Triage Vitals  Enc Vitals Group     BP 08/10/17 1340 103/63     Pulse Rate 08/10/17 1340 65     Resp 08/10/17 1340 18     Temp 08/10/17 1340 97.8 F (36.6 C)     Temp Source 08/10/17 1340 Oral     SpO2 08/10/17 1340 100 %     Weight 08/10/17 1341 57.2 kg (126 lb)     Height 08/10/17 1341 1.549 m (5\' 1" )     Head Circumference --      Peak Flow --      Pain Score 08/10/17 1341 9     Pain Loc --      Pain Edu? --      Excl. in GC? --     Constitutional: Alert and oriented. No acute distress. Pleasant and interactive Eyes: Conjunctivae are normal.  EOMI  Nose: No congestion/rhinnorhea. Mouth/Throat: Mucous membranes are moist.    Cardiovascular: Normal rate, regular rhythm.   Good peripheral circulation. Respiratory: Normal respiratory effort.  No retractions. Gastrointestinal: Soft and nontender. No distention.  No CVA tenderness. Genitourinary: deferred Musculoskeletal: No lower extremity tenderness nor edema.  Warm and well perfused Neurologic:  Normal speech and language. No gross focal neurologic deficits are appreciated.  Cranial nerves II through XII normal Skin:  Skin is warm, dry and intact. No rash noted. Psychiatric: Mood and affect are normal. Speech and behavior are normal.  ____________________________________________    LABS (all labs ordered are listed, but only abnormal results are displayed)  Labs Reviewed  COMPREHENSIVE METABOLIC PANEL - Abnormal; Notable for the following components:      Result Value   CO2 21 (*)    Calcium 7.8 (*)    Alkaline Phosphatase 37 (*)    All other components within normal limits  CBC  TROPONIN I   ____________________________________________  EKG  ED ECG REPORT I, Jene Every, the attending physician, personally viewed and interpreted this ECG.  Date: 08/10/2017  Rhythm: normal sinus rhythm QRS Axis: normal Intervals: normal ST/T Wave abnormalities: normal Narrative Interpretation: no evidence of acute ischemia  ____________________________________________  RADIOLOGY  None ____________________________________________   PROCEDURES  Procedure(s) performed: No  Procedures   Critical Care performed: No ____________________________________________   INITIAL IMPRESSION / ASSESSMENT AND PLAN / ED COURSE  Pertinent labs & imaging results that were available during my care of the patient were reviewed by me and considered in my medical decision making (see chart for details).  Patient presents with typical  migraine symptoms.  We will treat with IV Reglan, IV Benadryl, IV Toradol and IV fluids and reevaluate.  Will check labs and EKG given her description of chest discomfort, resolved prior to arrival.  She is at very low risk for ACS, low risk for PE. ----------------------------------------- 4:40 PM on 08/10/2017 -----------------------------------------   After treatment patient reports feeling significantly better, she would like to go home and rest and I feel this is reasonable.  Return precautions discussed   ____________________________________________   FINAL CLINICAL IMPRESSION(S) / ED DIAGNOSES  Final diagnoses:  Migraine without status migrainosus, not intractable, unspecified migraine type        Note:  This document was  prepared using Dragon voice recognition software and may include unintentional dictation errors.    Jene Every, MD 08/10/17 1640

## 2017-08-10 NOTE — ED Triage Notes (Addendum)
Pt comes via POV with c/o migraine and chest tightness. Pt states some visual distrubances that are not allowing her to focus. Pt states nausea but no vomiting. Pt states this all started Thursday. Pt states she had taken tylenol and Excedrin with no relief. Pt states she took emitrex today with no relief as well.

## 2017-08-10 NOTE — ED Notes (Signed)
Spoke with MD Cyril LoosenKinner and no need to draw labs at this time

## 2017-09-07 ENCOUNTER — Ambulatory Visit: Payer: Self-pay | Admitting: Physician Assistant

## 2017-09-13 ENCOUNTER — Ambulatory Visit (INDEPENDENT_AMBULATORY_CARE_PROVIDER_SITE_OTHER): Payer: BC Managed Care – PPO | Admitting: Physician Assistant

## 2017-09-13 ENCOUNTER — Encounter: Payer: Self-pay | Admitting: Physician Assistant

## 2017-09-13 DIAGNOSIS — G43101 Migraine with aura, not intractable, with status migrainosus: Secondary | ICD-10-CM | POA: Diagnosis not present

## 2017-09-13 MED ORDER — RIZATRIPTAN BENZOATE 10 MG PO TABS: 10.0000 mg | ORAL_TABLET | ORAL | 0 refills | Status: AC | PRN

## 2017-09-13 MED ORDER — AMITRIPTYLINE HCL 25 MG PO TABS: 25.0000 mg | ORAL_TABLET | Freq: Every day | ORAL | 1 refills | Status: AC

## 2017-09-13 NOTE — Patient Instructions (Signed)
Rizatriptan tablets What is this medicine? RIZATRIPTAN (rye za TRIP tan) is used to treat migraines with or without aura. An aura is a strange feeling or visual disturbance that warns you of an attack. It is not used to prevent migraines. This medicine may be used for other purposes; ask your health care provider or pharmacist if you have questions. COMMON BRAND NAME(S): Maxalt What should I tell my health care provider before I take this medicine? They need to know if you have any of these conditions: -bowel disease or colitis -diabetes -family history of heart disease -fast or irregular heart beat -heart or blood vessel disease, angina (chest pain), or previous heart attack -high blood pressure -high cholesterol -history of stroke, transient ischemic attacks (TIAs or mini-strokes), or intracranial bleeding -kidney or liver disease -overweight -poor circulation -postmenopausal or surgical removal of uterus and ovaries -Raynaud's disease -seizure disorder -an unusual or allergic reaction to rizatriptan, other medicines, foods, dyes, or preservatives -pregnant or trying to get pregnant -breast-feeding How should I use this medicine? This medicine is taken by mouth with a glass of water. Follow the directions on the prescription label. This medicine is taken at the first symptoms of a migraine. It is not for everyday use. If your migraine headache returns after one dose, you can take another dose as directed. You must leave at least 2 hours between doses, and do not take more than 30 mg total in 24 hours. If there is no improvement at all after the first dose, do not take a second dose without talking to your doctor or health care professional. Do not take your medicine more often than directed. Talk to your pediatrician regarding the use of this medicine in children. While this drug may be prescribed for children as young as 6 years for selected conditions, precautions do apply. Overdosage:  If you think you have taken too much of this medicine contact a poison control center or emergency room at once. NOTE: This medicine is only for you. Do not share this medicine with others. What if I miss a dose? This does not apply; this medicine is not for regular use. What may interact with this medicine? Do not take this medicine with any of the following medicines: -amphetamine, dextroamphetamine or cocaine -dihydroergotamine, ergotamine, ergoloid mesylates, methysergide, or ergot-type medication - do not take within 24 hours of taking rizatriptan -feverfew -MAOIs like Carbex, Eldepryl, Marplan, Nardil, and Parnate - do not take rizatriptan within 2 weeks of stopping MAOI therapy. -other migraine medicines like almotriptan, eletriptan, naratriptan, sumatriptan, zolmitriptan - do not take within 24 hours of taking rizatriptan -tryptophan This medicine may also interact with the following medications: -medicines for mental depression, anxiety or mood problems -propranolol This list may not describe all possible interactions. Give your health care provider a list of all the medicines, herbs, non-prescription drugs, or dietary supplements you use. Also tell them if you smoke, drink alcohol, or use illegal drugs. Some items may interact with your medicine. What should I watch for while using this medicine? Only take this medicine for a migraine headache. Take it if you get warning symptoms or at the start of a migraine attack. It is not for regular use to prevent migraine attacks. You may get drowsy or dizzy. Do not drive, use machinery, or do anything that needs mental alertness until you know how this medicine affects you. To reduce dizzy or fainting spells, do not sit or stand up quickly, especially if you are an older   patient. Alcohol can increase drowsiness, dizziness and flushing. Avoid alcoholic drinks. Smoking cigarettes may increase the risk of heart-related side effects from using this  medicine. If you take migraine medicines for 10 or more days a month, your migraines may get worse. Keep a diary of headache days and medicine use. Contact your healthcare professional if your migraine attacks occur more frequently. What side effects may I notice from receiving this medicine? Side effects that you should report to your doctor or health care professional as soon as possible: -allergic reactions like skin rash, itching or hives, swelling of the face, lips, or tongue -fast, slow, or irregular heart beat -increased or decreased blood pressure -seizures -severe stomach pain and cramping, bloody diarrhea -signs and symptoms of a blood clot such as breathing problems; changes in vision; chest pain; severe, sudden headache; pain, swelling, warmth in the leg; trouble speaking; sudden numbness or weakness of the face, arm or leg -tingling, pain, or numbness in the face, hands, or feet Side effects that usually do not require medical attention (report to your doctor or health care professional if they continue or are bothersome): -drowsiness -dry mouth -feeling warm, flushing, or redness of the face -headache -muscle cramps, pain -nausea, vomiting -unusually weak or tired This list may not describe all possible side effects. Call your doctor for medical advice about side effects. You may report side effects to FDA at 1-800-FDA-1088. Where should I keep my medicine? Keep out of the reach of children. Store at room temperature between 15 and 30 degrees C (59 and 86 degrees F). Keep container tightly closed. Throw away any unused medicine after the expiration date. NOTE: This sheet is a summary. It may not cover all possible information. If you have questions about this medicine, talk to your doctor, pharmacist, or health care provider.  2018 Elsevier/Gold Standard (2013-01-31 10:16:39)  

## 2017-09-13 NOTE — Progress Notes (Signed)
Patient: Carrie LoserSarah M Aponte Female    DOB: [birth year]   27 y.o.   MRN: [id number] Visit Date: 09/13/2017  Today's Provider: Margaretann LovelessJennifer M Isaly Fasching, PA-C   Chief Complaint  Patient presents with  . Follow-up   Subjective:    HPI  Follow up for headaches   The patient was last seen for this 5 weeks ago. Changes made at last visit include starting amitriptyline daily and imitrex as needed.  She reports excellent compliance with treatment. She feels that condition is Improved. She is having side effects. Anxiety with Imitex. Patient reports she has used it 3 times since last office visit. ------------------------------------------------------------------------------------    Allergies  Allergen Reactions  . Latex Rash     Current Outpatient Medications:  .  acetaminophen (TYLENOL) 500 MG tablet, Take 1,000 mg by mouth every 8 (eight) hours as needed., Disp: , Rfl:  .  amitriptyline (ELAVIL) 25 MG tablet, Take 1 tablet (25 mg total) by mouth at bedtime., Disp: 30 tablet, Rfl: 0 .  aspirin-acetaminophen-caffeine (EXCEDRIN MIGRAINE) 250-250-65 MG tablet, Take 1 tablet by mouth every 6 (six) hours as needed for headache or migraine., Disp: , Rfl:  .  butalbital-acetaminophen-caffeine (FIORICET, ESGIC) 50-325-40 MG tablet, Take 1-2 tablets by mouth every 6 (six) hours as needed for headache., Disp: 20 tablet, Rfl: 0 .  metaxalone (SKELAXIN) 800 MG tablet, Take 0.5-1 tablets (400-800 mg total) by mouth at bedtime., Disp: 15 tablet, Rfl: 0 .  SUMAtriptan (IMITREX) 50 MG tablet, Take 1 tablet (50 mg total) by mouth every 2 (two) hours as needed for migraine. May repeat in 2 hours if headache persists or recurs., Disp: 10 tablet, Rfl: 0  Review of Systems  Constitutional: Negative.   Respiratory: Negative.   Cardiovascular: Negative.   Neurological: Negative.     Social History   Tobacco Use  . Smoking status: Never Smoker  . Smokeless tobacco: Never Used  Substance Use Topics    . Alcohol use: Yes    Comment: SOCIAL   Objective:   LMP 10/29/2015 (Exact Date)  There were no vitals filed for this visit.   Physical Exam  Constitutional: She is oriented to person, place, and time. She appears well-developed and well-nourished. No distress.  Neck: Normal range of motion. Neck supple.  Cardiovascular: Normal rate, regular rhythm and normal heart sounds. Exam reveals no gallop and no friction rub.  No murmur heard. Pulmonary/Chest: Effort normal and breath sounds normal. No respiratory distress. She has no wheezes. She has no rales.  Neurological: She is alert and oriented to person, place, and time.  Skin: She is not diaphoretic.  Psychiatric: She has a normal mood and affect. Her behavior is normal. Judgment and thought content normal.  Vitals reviewed.     Assessment & Plan:     1. Migraine with aura and with status migrainosus, not intractable Side effects occurred with Imitrex. Will switch treatment for breakthrough migraines to Maxalt as below. She is to call if still having side effects or intolerant to it and will change therapy. Continue Elavil as below as this is helping to lessen migraine frequency and she is tolerating well.  - rizatriptan (MAXALT) 10 MG tablet; Take 1 tablet (10 mg total) by mouth as needed for migraine. May repeat in 2 hours if needed  Dispense: 10 tablet; Refill: 0 - amitriptyline (ELAVIL) 25 MG tablet; Take 1 tablet (25 mg total) by mouth at bedtime.  Dispense: 90 tablet; Refill: 1  Mar Daring, PA-C  [facility]

## 2017-09-23 ENCOUNTER — Encounter: Payer: Self-pay | Admitting: Physician Assistant

## 2017-10-12 NOTE — Progress Notes (Signed)
Patient: Carrie Rios Female    DOB: May 18, 1991   27 y.o.   MRN: [id number] Visit Date: 10/13/2017  Today's Provider: Margaretann Loveless, PA-C   Chief Complaint  Patient presents with  . Breast Mass   Subjective:    HPI   Patient comes in office today with concerns of mass/lump that was found on 10/08/17 while taking a shower. Patient reports that she was experiencing sharp shooting pain in her left breast a week prior to finding mass/lump. Patient states that her breast was tender to the touch in area of question and states that since 10/08/17 she has not had anymore tenderness of breast when being touched. Patient states that she has a maternal history of breast cancer in her grandmother (lumpectomy).   Allergies  Allergen Reactions  . Latex Rash     Current Outpatient Medications:  .  acetaminophen (TYLENOL) 500 MG tablet, Take 1,000 mg by mouth every 8 (eight) hours as needed., Disp: , Rfl:  .  amitriptyline (ELAVIL) 25 MG tablet, Take 1 tablet (25 mg total) by mouth at bedtime., Disp: 90 tablet, Rfl: 1 .  aspirin-acetaminophen-caffeine (EXCEDRIN MIGRAINE) 250-250-65 MG tablet, Take 1 tablet by mouth every 6 (six) hours as needed for headache or migraine., Disp: , Rfl:  .  metaxalone (SKELAXIN) 800 MG tablet, Take 0.5-1 tablets (400-800 mg total) by mouth at bedtime. (Patient not taking: Reported on 10/13/2017), Disp: 15 tablet, Rfl: 0 .  rizatriptan (MAXALT) 10 MG tablet, Take 1 tablet (10 mg total) by mouth as needed for migraine. May repeat in 2 hours if needed (Patient not taking: Reported on 10/13/2017), Disp: 10 tablet, Rfl: 0  Review of Systems  Constitutional: Negative.   HENT: Negative.   Respiratory: Negative.   Gastrointestinal: Negative.   Genitourinary: Negative.   Musculoskeletal: Negative.   Allergic/Immunologic: Negative.   Neurological: Negative.     Social History   Tobacco Use  . Smoking status: Never Smoker  . Smokeless tobacco: Never Used    Substance Use Topics  . Alcohol use: Yes    Comment: SOCIAL   Objective:   BP 106/80   Pulse 81   Temp 98.1 F (36.7 C) (Oral)   Resp 16   Wt 121 lb 9.6 oz (55.2 kg)   LMP 10/29/2015 (Exact Date)   BMI 22.98 kg/m  Vitals:   10/13/17 1126  BP: 106/80  Pulse: 81  Resp: 16  Temp: 98.1 F (36.7 C)  TempSrc: Oral  Weight: 121 lb 9.6 oz (55.2 kg)     Physical Exam  Pulmonary/Chest: Right breast exhibits no inverted nipple, no mass, no nipple discharge, no skin change and no tenderness. Left breast exhibits mass. Left breast exhibits no inverted nipple, no nipple discharge, no skin change and no tenderness. No breast swelling, tenderness, discharge or bleeding. Breasts are symmetrical.          Assessment & Plan:     1. Left breast mass Suspect cyst due to the feeling of the mass, but due to patient's family history will get imaging as below to further delineate the lesion. Advised may continue warm compresses and tylenol/IBU prn for pain. If it is a cyst but continues to grow patient is to call back to arrange aspiration with a surgeon. She voices understanding.  - MM Digital Diagnostic Unilat L; Future - US BREAST LTD UNI LEFT INC AXILLA; Future  2. Family history of breast cancer in female Maternal grandmother, lumpectomy. No  recurrence. - MM Digital Diagnostic Unilat L; Future - US BREAST LTD UNI LEFT INC AXILLA; Future       Margaretann Loveless, PA-C  [facility]

## 2017-10-13 ENCOUNTER — Ambulatory Visit: Payer: BC Managed Care – PPO | Admitting: Physician Assistant

## 2017-10-13 ENCOUNTER — Encounter: Payer: Self-pay | Admitting: Physician Assistant

## 2017-10-13 VITALS — BP 106/80 | HR 81 | Temp 98.1°F | Resp 16 | Wt 121.6 lb

## 2017-10-13 DIAGNOSIS — Z803 Family history of malignant neoplasm of breast: Secondary | ICD-10-CM

## 2017-10-13 DIAGNOSIS — N6320 Unspecified lump in the left breast, unspecified quadrant: Secondary | ICD-10-CM | POA: Diagnosis not present

## 2017-10-13 NOTE — Patient Instructions (Signed)
Breast Cyst A breast cyst is a sac in the breast that is filled with fluid. Breast cysts are usually noncancerous (benign). They are common among women, and they are most often located in the upper, outer portion of the breast. One or more cysts may develop. They form when fluid builds up inside of the breast glands. There are several types of breast cysts:  Macrocyst. This is a cyst that is about 2 inches (5.1 cm) across (in diameter).  Microcyst. This is a very small cyst that you cannot feel, but it can be seen with imaging tests such as an X-ray of the breast (mammogram) or ultrasound.  Galactocele. This is a cyst that contains milk. It may develop if you suddenly stop breastfeeding.  Breast cysts do not increase your risk of breast cancer. They usually disappear after menopause, unless you take artificial hormones (are on hormone therapy). What are the causes? The exact cause of breast cysts is not known. Possible causes include:  Blockage of tubes (ducts) in the breast glands, which leads to fluid buildup. Duct blockage may result from: ? Fibrocystic breast changes. This is a common, benign condition that occurs when women go through hormonal changes during the menstrual cycle. This is a common cause of multiple breast cysts. ? Overgrowth of breast tissue or breast glands. ? Scar tissue in the breast from previous surgery.  Changes in certain female hormones (estrogen and progesterone).  What increases the risk? You may be more likely to develop breast cysts if you have not gone through menopause. What are the signs or symptoms? Symptoms of a breast cyst may include:  Feeling one or more smooth, round, soft lumps (like grapes) in the breast that are easily moveable. The lump(s) may get bigger and more painful before your period and get smaller after your period.  Breast discomfort or pain.  How is this diagnosed? A cyst can be felt during a physical exam by your health care  provider. A mammogram and ultrasound will be done to confirm the diagnosis. Fluid may be removed from the cyst with a needle (fine-needle aspiration) and tested to make sure the cyst is not cancerous. How is this treated? Treatment may not be necessary. Your health care provider may monitor the cyst to see if it goes away on its own. If the cyst is uncomfortable or gets bigger, or if you do not like how the cyst makes your breast look, you may need treatment. Treatment may include:  Hormone treatment.  Fine-needle aspiration, to drain fluid from the cyst. There is a chance of the cyst coming back (recurring) after aspiration.  Surgery to remove the cyst.  Follow these instructions at home:  See your health care provider regularly. ? Get a yearly physical exam. ? If you are 20-40 years old, get a clinical breast exam every 1-3 years. After age 40, get this exam every year. ? Get mammograms as often as directed.  Do a breast self-exam every month, or as often as directed. Having many breast cysts, or "lumpy" breasts, may make it harder to feel for new lumps. Understand how your breasts normally look and feel, and write down any changes in your breasts so you can tell your health care provider about the changes. A breast self-exam involves: ? Comparing your breasts in the mirror. ? Looking for visible changes in your skin or nipples. ? Feeling for lumps or changes.  Take over-the-counter and prescription medicines only as told by your health care   provider.  Wear a supportive bra, especially when exercising.  Follow instructions from your health care provider about eating and drinking restrictions. ? Avoid caffeine. ? Cut down on salt (sodium) in what you eat and drink, especially before your menstrual period. Too much sodium can cause fluid buildup (retention), breast swelling, and discomfort.  Keep all follow-up visits as told your health care provider. This is important. Contact a  health care provider if:  You feel, or think you feel, a lump in your breast.  You notice that both breasts look or feel different than usual.  Your breast is still causing pain after your menstrual period is over.  You find new lumps or bumps that were not there before.  You feel lumps in your armpit (axilla). Get help right away if:  You have severe pain, tenderness, redness, or warmth in your breast.  You have fluid or blood leaking from your nipple.  Your breast lump becomes hard and painful.  You notice dimpling or wrinkling of the breast or nipple. This information is not intended to replace advice given to you by your health care provider. Make sure you discuss any questions you have with your health care provider. Document Released: 06/01/2005 Document Revised: 02/21/2016 Document Reviewed: 02/21/2016 Elsevier Interactive Patient Education  2017 Elsevier Inc.  

## 2017-10-18 ENCOUNTER — Other Ambulatory Visit: Payer: BC Managed Care – PPO

## 2017-10-19 ENCOUNTER — Ambulatory Visit
Admission: RE | Admit: 2017-10-19 | Discharge: 2017-10-19 | Disposition: A | Discharge status: Home / Self Care | Payer: BC Managed Care – PPO | Source: Ambulatory Visit | Attending: Physician Assistant | Admitting: Physician Assistant

## 2017-10-19 DIAGNOSIS — Z803 Family history of malignant neoplasm of breast: Secondary | ICD-10-CM | POA: Insufficient documentation

## 2017-10-19 DIAGNOSIS — N6320 Unspecified lump in the left breast, unspecified quadrant: Secondary | ICD-10-CM | POA: Diagnosis present

## 2017-11-03 ENCOUNTER — Encounter: Payer: Self-pay | Admitting: Physician Assistant

## 2018-03-29 ENCOUNTER — Encounter: Payer: Self-pay | Admitting: Physician Assistant

## 2018-03-29 DIAGNOSIS — B9689 Other specified bacterial agents as the cause of diseases classified elsewhere: Secondary | ICD-10-CM

## 2018-03-29 DIAGNOSIS — N760 Acute vaginitis: Principal | ICD-10-CM

## 2018-03-30 MED ORDER — METRONIDAZOLE 500 MG PO TABS: 500.0000 mg | ORAL_TABLET | Freq: Two times a day (BID) | ORAL | 0 refills | Status: DC

## 2019-01-21 ENCOUNTER — Encounter: Payer: Self-pay | Admitting: Physician Assistant

## 2019-01-21 DIAGNOSIS — B9689 Other specified bacterial agents as the cause of diseases classified elsewhere: Secondary | ICD-10-CM

## 2019-01-21 DIAGNOSIS — N760 Acute vaginitis: Secondary | ICD-10-CM

## 2019-01-23 MED ORDER — METRONIDAZOLE 500 MG PO TABS: 500.0000 mg | ORAL_TABLET | Freq: Two times a day (BID) | ORAL | 0 refills | Status: DC

## 2019-02-03 ENCOUNTER — Encounter: Payer: Self-pay | Admitting: Physician Assistant

## 2019-02-13 ENCOUNTER — Encounter: Payer: Self-pay | Admitting: Physician Assistant

## 2019-02-13 ENCOUNTER — Other Ambulatory Visit: Payer: Self-pay

## 2019-02-13 ENCOUNTER — Ambulatory Visit: Payer: BC Managed Care – PPO | Admitting: Physician Assistant

## 2019-02-13 VITALS — BP 107/67 | HR 70 | Temp 96.9°F | Wt 122.2 lb

## 2019-02-13 DIAGNOSIS — N760 Acute vaginitis: Secondary | ICD-10-CM

## 2019-02-13 DIAGNOSIS — R3989 Other symptoms and signs involving the genitourinary system: Secondary | ICD-10-CM

## 2019-02-13 DIAGNOSIS — R5383 Other fatigue: Secondary | ICD-10-CM | POA: Diagnosis not present

## 2019-02-13 DIAGNOSIS — R6882 Decreased libido: Secondary | ICD-10-CM | POA: Diagnosis not present

## 2019-02-13 DIAGNOSIS — B9689 Other specified bacterial agents as the cause of diseases classified elsewhere: Secondary | ICD-10-CM

## 2019-02-13 LAB — POCT URINALYSIS DIPSTICK
Bilirubin, UA: NEGATIVE
Blood, UA: NEGATIVE
Glucose, UA: NEGATIVE
Ketones, UA: NEGATIVE
Leukocytes, UA: NEGATIVE
Nitrite, UA: POSITIVE
Protein, UA: NEGATIVE
Spec Grav, UA: 1.015 (ref 1.010–1.025)
Urobilinogen, UA: 0.2 E.U./dL
pH, UA: 6.5 (ref 5.0–8.0)

## 2019-02-13 MED ORDER — SULFAMETHOXAZOLE-TRIMETHOPRIM 800-160 MG PO TABS: 1.0000 | ORAL_TABLET | Freq: Two times a day (BID) | ORAL | 0 refills | Status: DC

## 2019-02-13 NOTE — Progress Notes (Signed)
Patient: Carrie Rios Female    DOB: [birth year]   28 y.o.   MRN: [id number] Visit Date: 02/13/2019  Today's Provider: Mar Daring, PA-C   Chief Complaint  Patient presents with  . Follow-up   Subjective:    HPI   Follow-up Patient states that she is concerned about having recurrent bacterial infection and her hormone levels. Patient states that whenever she stop taking her antibiotics it seems like the infection is coming back. Patient states that she has a foul odor and heavy discharge. Patient states that she takes AZO chewable.  She also has had recurrent episodes of BV over the last 3 years since her hysterectomy. She has a total hysterectomy with one oophorectomy in 2017, one ovary remains. She has seen GYN and has been treated with metronidazole oral and gel multiple times. She also takes precautions and cleans prior to sexual encounters.   Also noticing decreased libido. She reports no sexual desire. She does have 2 children (10/girl and 7/boy) that are remote learning due to covid. 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 She has not been sleeping well, especially when on nights.    Allergies  Allergen Reactions  . Latex Rash     Current Outpatient Medications:  .  amitriptyline (ELAVIL) 25 MG tablet, Take 1 tablet (25 mg total) by mouth at bedtime., Disp: 90 tablet, Rfl: 1 .  aspirin-acetaminophen-caffeine (EXCEDRIN MIGRAINE) 250-250-65 MG tablet, Take 1 tablet by mouth every 6 (six) hours as needed for headache or migraine., Disp: , Rfl:  .  metroNIDAZOLE (FLAGYL) 500 MG tablet, Take 1 tablet (500 mg total) by mouth 2 (two) times daily., Disp: 14 tablet, Rfl: 0 .  acetaminophen (TYLENOL) 500 MG tablet, Take 1,000 mg by mouth every 8 (eight) hours as needed., Disp: , Rfl:  .  metaxalone (SKELAXIN) 800 MG tablet, Take 0.5-1 tablets (400-800 mg total) by mouth at bedtime. (Patient not taking: Reported  on 10/13/2017), Disp: 15 tablet, Rfl: 0 .  rizatriptan (MAXALT) 10 MG tablet, Take 1 tablet (10 mg total) by mouth as needed for migraine. May repeat in 2 hours if needed (Patient not taking: Reported on 10/13/2017), Disp: 10 tablet, Rfl: 0  Review of Systems  Constitutional: Positive for fatigue.  Respiratory: Negative.   Cardiovascular: Negative.   Gastrointestinal: Negative.   Genitourinary: Positive for dysuria and vaginal discharge. Negative for flank pain, frequency, genital sores, hematuria and pelvic pain.       Urine has odor  Neurological: Negative.     Social History   Tobacco Use  . Smoking status: Never Smoker  . Smokeless tobacco: Never Used  Substance Use Topics  . Alcohol use: Yes    Comment: SOCIAL      Objective:   BP 107/67 (BP Location: Left Arm, Patient Position: Sitting, Cuff Size: Normal)   Pulse 70   Temp (!) 96.9 F (36.1 C) (Temporal)   Wt 122 lb 3.2 oz (55.4 kg)   LMP 10/29/2015 (Exact Date)   BMI 23.09 kg/m  Vitals:   02/13/19 0850  BP: 107/67  Pulse: 70  Temp: (!) 96.9 F (36.1 C)  TempSrc: Temporal  Weight: 122 lb 3.2 oz (55.4 kg)  Body mass index is 23.09 kg/m.   Physical Exam Vitals signs reviewed.  Constitutional:      General: She is not in acute distress.    Appearance: She is well-developed  and normal weight. She is not ill-appearing or diaphoretic.  Neck:     Musculoskeletal: Normal range of motion and neck supple.  Cardiovascular:     Rate and Rhythm: Normal rate and regular rhythm.     Pulses: Normal pulses.     Heart sounds: Normal heart sounds. No murmur. No friction rub. No gallop.   Pulmonary:     Effort: Pulmonary effort is normal. No respiratory distress.     Breath sounds: Normal breath sounds. No wheezing or rales.  Abdominal:     General: Abdomen is flat. Bowel sounds are normal.     Palpations: Abdomen is soft.     Tenderness: There is no abdominal tenderness. There is no right CVA tenderness or left CVA  tenderness.  Neurological:     Mental Status: She is alert.     No results found for any visits on 02/13/19.     Assessment & Plan    1. Possible urinary tract infection UA positive for nitrites. Will treat empirically with Bactrim as below. Urine sent for culture. Will f/u pending results. Will check labs as below and f/u pending results. - POCT urinalysis dipstick - sulfamethoxazole-trimethoprim (BACTRIM DS) 800-160 MG tablet; Take 1 tablet by mouth 2 (two) times daily.  Dispense: 14 tablet; Refill: 0 - CBC w/Diff/Platelet - Comprehensive Metabolic Panel (CMET) - Urine Culture  2. Decreased libido H/O early menopause in her mother and patient with hysterectomy at 28 yrs old and only one ovary remains intact. Will check labs as below and f/u pending results. - TSH - Vitamin D (25 hydroxy) - FSH/LH - Estrogens, total  3. Fatigue, unspecified type Suspect multifactorial with work and home stress, lack of adequate sleep. Will check labs as below and f/u pending results to r/o organic sources.  - TSH - Vitamin D (25 hydroxy) - FSH/LH - Estrogens, total  4. BV (bacterial vaginosis) Recurrent infections despite trying to prevent. Discussed possibly considering boric acid suppositories x 7 days vs vaginal probiotics. Will treat UTI as above initially and will consider other options noted above if symptoms persist.      Carrie LovelessJennifer M Ortha Metts, PA-C  [facility]

## 2019-02-15 LAB — URINE CULTURE

## 2019-02-16 ENCOUNTER — Encounter: Payer: Self-pay | Admitting: Physician Assistant

## 2019-02-16 LAB — COMPREHENSIVE METABOLIC PANEL
ALT: 20 IU/L (ref 0–32)
AST: 20 IU/L (ref 0–40)
Albumin/Globulin Ratio: 1.6 (ref 1.2–2.2)
Albumin: 4.3 g/dL (ref 3.9–5.0)
Alkaline Phosphatase: 45 IU/L (ref 39–117)
BUN/Creatinine Ratio: 13 (ref 9–23)
BUN: 11 mg/dL (ref 6–20)
Bilirubin Total: 0.5 mg/dL (ref 0.0–1.2)
CO2: 21 mmol/L (ref 20–29)
Calcium: 8.9 mg/dL (ref 8.7–10.2)
Chloride: 102 mmol/L (ref 96–106)
Creatinine, Ser: 0.82 mg/dL (ref 0.57–1.00)
GFR calc Af Amer: 111 mL/min/{1.73_m2} (ref >59)
GFR calc non Af Amer: 96 mL/min/{1.73_m2} (ref >59)
Globulin, Total: 2.7 g/dL (ref 1.5–4.5)
Glucose: 87 mg/dL (ref 65–99)
Potassium: 4.3 mmol/L (ref 3.5–5.2)
Sodium: 139 mmol/L (ref 134–144)
Total Protein: 7 g/dL (ref 6.0–8.5)

## 2019-02-16 LAB — VITAMIN D 25 HYDROXY (VIT D DEFICIENCY, FRACTURES): Vit D, 25-Hydroxy: 42.6 ng/mL (ref 30.0–100.0)

## 2019-02-16 LAB — CBC WITH DIFFERENTIAL/PLATELET
Basophils Absolute: 0 10*3/uL (ref 0.0–0.2)
Basos: 1 %
EOS (ABSOLUTE): 0.1 10*3/uL (ref 0.0–0.4)
Eos: 2 %
Hematocrit: 37.3 % (ref 34.0–46.6)
Hemoglobin: 12.6 g/dL (ref 11.1–15.9)
Immature Grans (Abs): 0 10*3/uL (ref 0.0–0.1)
Immature Granulocytes: 0 %
Lymphocytes Absolute: 1.8 10*3/uL (ref 0.7–3.1)
Lymphs: 28 %
MCH: 30.8 pg (ref 26.6–33.0)
MCHC: 33.8 g/dL (ref 31.5–35.7)
MCV: 91 fL (ref 79–97)
Monocytes Absolute: 0.5 10*3/uL (ref 0.1–0.9)
Monocytes: 7 %
Neutrophils Absolute: 3.9 10*3/uL (ref 1.4–7.0)
Neutrophils: 62 %
Platelets: 185 10*3/uL (ref 150–450)
RBC: 4.09 x10E6/uL (ref 3.77–5.28)
RDW: 12.7 % (ref 11.7–15.4)
WBC: 6.3 10*3/uL (ref 3.4–10.8)

## 2019-02-16 LAB — ESTROGENS, TOTAL: Estrogen: 206 pg/mL

## 2019-02-16 LAB — FSH/LH
FSH: 9.7 m[IU]/mL
LH: 7.4 m[IU]/mL

## 2019-02-16 LAB — TSH: TSH: 1.95 u[IU]/mL (ref 0.450–4.500)

## 2019-06-07 ENCOUNTER — Other Ambulatory Visit: Payer: Self-pay | Admitting: Physician Assistant

## 2019-06-07 DIAGNOSIS — R3989 Other symptoms and signs involving the genitourinary system: Secondary | ICD-10-CM

## 2019-06-08 ENCOUNTER — Other Ambulatory Visit: Payer: Self-pay | Admitting: Physician Assistant

## 2019-06-08 ENCOUNTER — Encounter: Payer: Self-pay | Admitting: Physician Assistant

## 2019-06-08 DIAGNOSIS — R3989 Other symptoms and signs involving the genitourinary system: Secondary | ICD-10-CM

## 2019-06-08 NOTE — Telephone Encounter (Signed)
LMTCB 06/08/2019  Thanks,   -Mickel Baas

## 2019-06-12 MED ORDER — SULFAMETHOXAZOLE-TRIMETHOPRIM 800-160 MG PO TABS: 1.0000 | ORAL_TABLET | Freq: Two times a day (BID) | ORAL | 0 refills | Status: DC

## 2019-08-15 ENCOUNTER — Other Ambulatory Visit: Payer: Self-pay | Admitting: Physician Assistant

## 2019-08-15 DIAGNOSIS — R3989 Other symptoms and signs involving the genitourinary system: Secondary | ICD-10-CM

## 2019-08-15 NOTE — Telephone Encounter (Signed)
Requested medication (s) are due for refill today:   Not sure  Requested medication (s) are on the active medication list:   Yes  Future visit scheduled:   No   Last ordered: 2 months ago by [facility]   Not a protocol for this medication Bactrim DS  Requested Prescriptions  Pending Prescriptions Disp Refills   sulfamethoxazole-trimethoprim (BACTRIM DS) 800-160 MG tablet [Pharmacy Med Name: Sulfamethoxazole-Trimethoprim 800-160 MG Oral Tablet] 14 tablet 0    Sig: Take 1 tablet by mouth twice daily      Off-Protocol Failed - 08/15/2019  7:32 AM      Failed - Medication not assigned to a protocol, review manually.      Passed - Valid encounter within last 12 months    Recent Outpatient Visits           6 months ago Possible urinary tract infection   [facility] [place], [place], [place]   1 year ago Left breast mass   [facility] [place], [place], [place]   1 year ago Migraine with aura and with status migrainosus, not intractable   [facility], [place], [place]   2 years ago Migraine with aura and with status migrainosus, not intractable   [facility] [place], [place], [place]   2 years ago Acute right flank pain   [facility] [place], [place]

## 2019-11-01 ENCOUNTER — Encounter: Payer: Self-pay | Admitting: Physician Assistant

## 2019-11-01 NOTE — Telephone Encounter (Signed)
Typically needs appt (could be virtual) for referral to be placed.

## 2019-12-14 IMAGING — US US BREAST*L* LIMITED INC AXILLA
1 series · 1 of 1 positions shown · non-contrast
Comparison: None.

CLINICAL DATA: 26-year-old who underwent vaginal hysterectomy in
November 2015 with BILATERAL salpingectomy and RIGHT oophorectomy.
Patient still has her LEFT ovary. She has cyclical breast pain, but
recently had focal pain in the UPPER OUTER QUADRANT of the LEFT
breast which persisted. On self examination, she palpated a lump in
the UPPER OUTER QUADRANT in this location.

Family history of breast cancer in her maternal grandmother at age
approximately 50.
EXAM:
ULTRASOUND OF THE LEFT BREAST

[Series 1: us breast*left* limited inc axilla · 0.05mm/px · 1 of 1 slices shown]
[im 1/1]
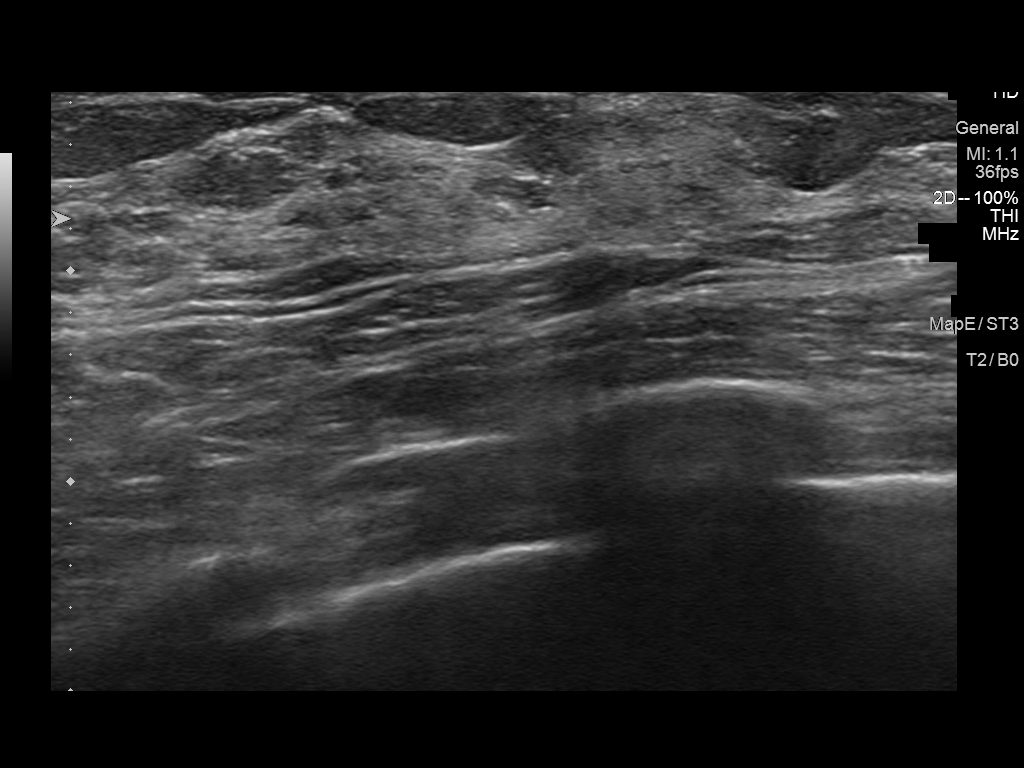

[1 of 1 positions shown; findings below may reference images not displayed]

FINDINGS: On physical exam, there is a palpable ridge of tissue in the UPPER
OUTER QUADRANT of the LEFT breast corresponding to what the patient
is feeling, though I do not palpate a discrete mass.

Targeted LEFT breast ultrasound is performed, showing normal dense
fibroglandular tissue at the 1 o'clock position approximately 8 cm
from the nipple in the area of palpable concern. The normal
fibroglandular tissue extends very close to the skin surface at this
location, accounting for what the patient is feeling. No cyst, solid
mass or abnormal acoustic shadowing is identified.
IMPRESSION: Normal examination.

Normal dense fibroglandular tissue in the UPPER OUTER QUADRANT of
the LEFT breast which is very close to the skin surface accounts for
the palpable concern.

RECOMMENDATION:
Screening mammogram at age 38 unless there are persistent or
intervening clinical concerns. (Code:[id number]-9YG)

I have discussed the findings and recommendations with the patient.
Results were also provided in writing at the conclusion of the
visit.

BI-RADS CATEGORY  1: Negative.

## 2019-12-15 ENCOUNTER — Other Ambulatory Visit: Payer: Self-pay | Admitting: Physician Assistant

## 2019-12-15 ENCOUNTER — Encounter: Payer: Self-pay | Admitting: Physician Assistant

## 2019-12-15 DIAGNOSIS — R3989 Other symptoms and signs involving the genitourinary system: Secondary | ICD-10-CM

## 2019-12-15 MED ORDER — CEPHALEXIN 500 MG PO CAPS: 500.0000 mg | ORAL_CAPSULE | Freq: Two times a day (BID) | ORAL | 0 refills | Status: AC

## 2020-01-03 ENCOUNTER — Encounter: Payer: Self-pay | Admitting: Physician Assistant

## 2020-01-05 ENCOUNTER — Encounter: Payer: Self-pay | Admitting: Physician Assistant

## 2020-01-29 NOTE — Progress Notes (Signed)
Established patient visit   Patient: Carrie Rios   DOB: Aug 23, 1990   28 y.o. Female  MRN: [id number] Visit Date: 01/30/2020  Today's healthcare provider: Trey Sailors, PA-C   Chief Complaint  Patient presents with  . Breast Mass   Subjective    HPI  Breast Lump Patient presents today with a lump in her left breast. She reports that she has had family history of breast cancer in her maternal grandmother. She reports pain on the underside of her breast. She thought this may be due to her underwire bra however she stopped wearing it as much and pain has not improved. No fevers, chills, weight loss. No redness in the area, skin changes. No nipple discharge.    UTI  Patient also mentions that this is a recurrent issue. She reports that her urine has a strong odor. She reports this is an intermittent symptom. Sometimes she will also have odorous vaginal discharge and vaginal burning/itching. She denies pelvic pain. She denies new sexual partners. Her last documented UTI was 01/2019 with pan sensitive E. Coli. She has been treated empirically for UTIs since then however she did not provide cultures at that time. She does have a history of BV as well.      Medications: Outpatient Medications Prior to Visit  Medication Sig  . acetaminophen (TYLENOL) 500 MG tablet Take 1,000 mg by mouth every 8 (eight) hours as needed.  Marland Kitchen amitriptyline (ELAVIL) 25 MG tablet Take 1 tablet (25 mg total) by mouth at bedtime. (Patient not taking: Reported on 01/30/2020)  . aspirin-acetaminophen-caffeine (EXCEDRIN MIGRAINE) 250-250-65 MG tablet Take 1 tablet by mouth every 6 (six) hours as needed for headache or migraine. (Patient not taking: Reported on 01/30/2020)  . cephALEXin (KEFLEX) 500 MG capsule Take 1 capsule (500 mg total) by mouth 2 (two) times daily. (Patient not taking: Reported on 01/30/2020)  . metaxalone (SKELAXIN) 800 MG tablet Take 0.5-1 tablets (400-800 mg total) by mouth at bedtime.  (Patient not taking: Reported on 10/13/2017)  . metroNIDAZOLE (FLAGYL) 500 MG tablet Take 1 tablet (500 mg total) by mouth 2 (two) times daily. (Patient not taking: Reported on 01/30/2020)  . rizatriptan (MAXALT) 10 MG tablet Take 1 tablet (10 mg total) by mouth as needed for migraine. May repeat in 2 hours if needed (Patient not taking: Reported on 10/13/2017)  . sulfamethoxazole-trimethoprim (BACTRIM DS) 800-160 MG tablet Take 1 tablet by mouth 2 (two) times daily. (Patient not taking: Reported on 01/30/2020)   No facility-administered medications prior to visit.    Review of Systems  Constitutional: Positive for appetite change.  Respiratory: Negative.   Cardiovascular: Negative.   Genitourinary: Positive for vaginal discharge. Negative for decreased urine volume, difficulty urinating, dysuria, enuresis, flank pain, frequency, hematuria and vaginal pain.  Neurological: Negative.       Objective    BP (!) 99/59   Pulse 71   Temp 98.9 F (37.2 C)   Ht 5\' 1"  (1.549 m)   Wt 129 lb (58.5 kg)   LMP 10/29/2015 (Exact Date)   BMI 24.37 kg/m    Physical Exam Exam conducted with a chaperone present.  Constitutional:      Appearance: Normal appearance. She is normal weight.  Chest:     Breasts:        Right: Normal. No swelling, bleeding, inverted nipple, mass, nipple discharge, skin change or tenderness.        Left: Normal. No swelling, bleeding, inverted nipple, mass, nipple  discharge, skin change or tenderness.  Skin:    General: Skin is warm and dry.  Neurological:     Mental Status: She is oriented to person, place, and time. Mental status is at baseline.  Psychiatric:        Mood and Affect: Mood normal.        Behavior: Behavior normal.      No results found for any visits on 01/30/20.  Assessment & Plan    1. Breast pain  I do not appreciate a mass today however patient is having pain and will proceed with [place] as below.   - US BREAST LTD UNI LEFT INC AXILLA  2.  Frequent UTI  Counseled on different causes of urinary/vaginal burning etc including UTI, BV, yeast, etc. She has one documented UTI and others have been treated empirically. Would recommend taking samples so we can have better sense of trend. Test as below and treat pending results.   - POCT urinalysis dipstick - Cervicovaginal ancillary only - Urine Culture    No follow-ups on file.      ITrey Sailors, PA-C, have reviewed all documentation for this visit. The documentation on 01/31/20 for the exam, diagnosis, procedures, and orders are all accurate and complete.  The entirety of the information documented in the History of Present Illness, Review of Systems and Physical Exam were personally obtained by me. Portions of this information were initially documented by Anson [place], CMA and reviewed by me for thoroughness and accuracy.        Maryella Shivers  [facility] [phone] (phone) [phone] (fax)  [facility]

## 2020-01-30 ENCOUNTER — Ambulatory Visit: Payer: BC Managed Care – PPO | Admitting: Physician Assistant

## 2020-01-30 ENCOUNTER — Encounter: Payer: Self-pay | Admitting: Physician Assistant

## 2020-01-30 ENCOUNTER — Other Ambulatory Visit: Payer: Self-pay

## 2020-01-30 ENCOUNTER — Other Ambulatory Visit (HOSPITAL_COMMUNITY)
Admission: RE | Admit: 2020-01-30 | Discharge: 2020-01-30 | Disposition: A | Discharge status: Home / Self Care | Payer: BC Managed Care – PPO | Source: Ambulatory Visit | Attending: Physician Assistant | Admitting: Physician Assistant

## 2020-01-30 VITALS — BP 99/59 | HR 71 | Temp 98.9°F | Ht 61.0 in | Wt 129.0 lb

## 2020-01-30 DIAGNOSIS — N644 Mastodynia: Secondary | ICD-10-CM | POA: Diagnosis not present

## 2020-01-30 DIAGNOSIS — N390 Urinary tract infection, site not specified: Secondary | ICD-10-CM | POA: Insufficient documentation

## 2020-01-30 DIAGNOSIS — N63 Unspecified lump in unspecified breast: Secondary | ICD-10-CM

## 2020-01-30 DIAGNOSIS — N3090 Cystitis, unspecified without hematuria: Secondary | ICD-10-CM

## 2020-01-30 DIAGNOSIS — B9689 Other specified bacterial agents as the cause of diseases classified elsewhere: Secondary | ICD-10-CM | POA: Diagnosis not present

## 2020-01-30 DIAGNOSIS — N760 Acute vaginitis: Secondary | ICD-10-CM

## 2020-01-30 LAB — POCT URINALYSIS DIPSTICK
Bilirubin, UA: NEGATIVE
Blood, UA: NEGATIVE
Glucose, UA: NEGATIVE
Ketones, UA: NEGATIVE
Leukocytes, UA: NEGATIVE
Nitrite, UA: NEGATIVE
Protein, UA: NEGATIVE
Spec Grav, UA: 1.02 (ref 1.010–1.025)
Urobilinogen, UA: 0.2 E.U./dL
pH, UA: 6.5 (ref 5.0–8.0)

## 2020-01-31 ENCOUNTER — Telehealth: Payer: Self-pay | Admitting: Physician Assistant

## 2020-01-31 DIAGNOSIS — R928 Other abnormal and inconclusive findings on diagnostic imaging of breast: Secondary | ICD-10-CM

## 2020-01-31 NOTE — Patient Instructions (Signed)

## 2020-01-31 NOTE — Telephone Encounter (Signed)
Per Delford Field they will need an order for diagnostic bilateral mammogram TOMO [id number]

## 2020-01-31 NOTE — Telephone Encounter (Signed)
Please review request for diagnostic mammogram. According to breast ultrasound form 10/19/17 patient was suppose to return or screening mammogram. Please advise if diagnostic is needed. KW

## 2020-02-01 ENCOUNTER — Ambulatory Visit: Payer: BC Managed Care – PPO | Admitting: Physician Assistant

## 2020-02-01 LAB — CERVICOVAGINAL ANCILLARY ONLY
Bacterial Vaginitis (gardnerella): POSITIVE — AB
Candida Glabrata: NEGATIVE
Candida Vaginitis: NEGATIVE
Chlamydia: NEGATIVE
Comment: NEGATIVE
Comment: NEGATIVE
Comment: NEGATIVE
Comment: NEGATIVE
Comment: NEGATIVE
Comment: NORMAL
Neisseria Gonorrhea: NEGATIVE
Trichomonas: NEGATIVE

## 2020-02-01 LAB — URINE CULTURE

## 2020-02-01 NOTE — Telephone Encounter (Signed)
ordered

## 2020-02-02 MED ORDER — SULFAMETHOXAZOLE-TRIMETHOPRIM 800-160 MG PO TABS: 1.0000 | ORAL_TABLET | Freq: Two times a day (BID) | ORAL | 0 refills | Status: AC

## 2020-02-02 MED ORDER — METRONIDAZOLE 500 MG PO TABS: 500.0000 mg | ORAL_TABLET | Freq: Two times a day (BID) | ORAL | 0 refills | Status: AC

## 2020-02-02 NOTE — Addendum Note (Signed)
Addended by: Trey Sailors on: 02/02/2020 08:35 AM   Modules accepted: Orders

## 2020-02-09 ENCOUNTER — Other Ambulatory Visit: Payer: BC Managed Care – PPO
